# Patient Record
Sex: Female | Born: 1990 | Race: White | Hispanic: No | Marital: Single | Smoking: Current every day smoker
Health system: Southern US, Community
[De-identification: ages and names within clinical notes are randomized; demographics above are authoritative.]

## PROBLEM LIST (undated history)

## (undated) DIAGNOSIS — R630 Anorexia: Secondary | ICD-10-CM

## (undated) DIAGNOSIS — F32A Depression, unspecified: Secondary | ICD-10-CM

## (undated) DIAGNOSIS — F329 Major depressive disorder, single episode, unspecified: Secondary | ICD-10-CM

## (undated) HISTORY — PX: BREAST SURGERY: SHX581

---
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
## 2012-04-21 ENCOUNTER — Emergency Department: Payer: Self-pay | Admitting: Emergency Medicine

## 2012-04-21 LAB — URINALYSIS, COMPLETE
Bilirubin,UR: NEGATIVE
Glucose,UR: NEGATIVE mg/dL (ref 0–75)
Ketone: NEGATIVE
Nitrite: POSITIVE
Ph: 6 (ref 4.5–8.0)
Protein: 30
RBC,UR: 49 /HPF (ref 0–5)
WBC UR: 13 /HPF (ref 0–5)

## 2012-04-21 LAB — CBC
HCT: 38.9 % (ref 35.0–47.0)
HGB: 13.5 g/dL (ref 12.0–16.0)
MCH: 32.8 pg (ref 26.0–34.0)
MCHC: 34.6 g/dL (ref 32.0–36.0)
RDW: 12.9 % (ref 11.5–14.5)
WBC: 7.5 10*3/uL (ref 3.6–11.0)

## 2012-09-02 ENCOUNTER — Encounter (HOSPITAL_COMMUNITY): Payer: Self-pay | Admitting: Emergency Medicine

## 2012-09-02 ENCOUNTER — Emergency Department (HOSPITAL_COMMUNITY)
Admission: EM | Admit: 2012-09-02 | Discharge: 2012-09-02 | Disposition: A | Discharge status: Home / Self Care | Payer: 59 | Attending: Emergency Medicine | Admitting: Emergency Medicine

## 2012-09-02 DIAGNOSIS — R630 Anorexia: Secondary | ICD-10-CM | POA: Insufficient documentation

## 2012-09-02 DIAGNOSIS — F3289 Other specified depressive episodes: Secondary | ICD-10-CM | POA: Insufficient documentation

## 2012-09-02 DIAGNOSIS — F1110 Opioid abuse, uncomplicated: Secondary | ICD-10-CM | POA: Insufficient documentation

## 2012-09-02 DIAGNOSIS — F17200 Nicotine dependence, unspecified, uncomplicated: Secondary | ICD-10-CM | POA: Insufficient documentation

## 2012-09-02 DIAGNOSIS — F1910 Other psychoactive substance abuse, uncomplicated: Secondary | ICD-10-CM

## 2012-09-02 DIAGNOSIS — Z79899 Other long term (current) drug therapy: Secondary | ICD-10-CM | POA: Insufficient documentation

## 2012-09-02 DIAGNOSIS — F329 Major depressive disorder, single episode, unspecified: Secondary | ICD-10-CM | POA: Insufficient documentation

## 2012-09-02 HISTORY — DX: Anorexia: R63.0

## 2012-09-02 HISTORY — DX: Major depressive disorder, single episode, unspecified: F32.9

## 2012-09-02 HISTORY — DX: Depression, unspecified: F32.A

## 2012-09-02 LAB — COMPREHENSIVE METABOLIC PANEL
ALT: 11 U/L (ref 0–35)
AST: 22 U/L (ref 0–37)
Albumin: 4.6 g/dL (ref 3.5–5.2)
Alkaline Phosphatase: 68 U/L (ref 39–117)
Calcium: 9.9 mg/dL (ref 8.4–10.5)
Potassium: 4.5 mEq/L (ref 3.5–5.1)
Sodium: 137 mEq/L (ref 135–145)
Total Protein: 7.6 g/dL (ref 6.0–8.3)

## 2012-09-02 LAB — RAPID URINE DRUG SCREEN, HOSP PERFORMED
Amphetamines: NOT DETECTED
Barbiturates: NOT DETECTED
Opiates: NOT DETECTED
Tetrahydrocannabinol: NOT DETECTED

## 2012-09-02 LAB — CBC WITH DIFFERENTIAL/PLATELET
Basophils Absolute: 0 10*3/uL (ref 0.0–0.1)
Eosinophils Absolute: 0.2 10*3/uL (ref 0.0–0.7)
Eosinophils Relative: 2 % (ref 0–5)
MCH: 31.6 pg (ref 26.0–34.0)
MCV: 91.2 fL (ref 78.0–100.0)
Neutrophils Relative %: 54 % (ref 43–77)
Platelets: 232 10*3/uL (ref 150–400)
RDW: 12.1 % (ref 11.5–15.5)
WBC: 8.8 10*3/uL (ref 4.0–10.5)

## 2012-09-02 MED ORDER — IBUPROFEN 200 MG PO TABS: 600.0000 mg | ORAL_TABLET | Freq: Three times a day (TID) | ORAL | Status: DC | PRN

## 2012-09-02 MED ORDER — ONDANSETRON HCL 8 MG PO TABS: 4.0000 mg | ORAL_TABLET | Freq: Three times a day (TID) | ORAL | Status: DC | PRN

## 2012-09-02 MED ORDER — ALUM & MAG HYDROXIDE-SIMETH 200-200-20 MG/5ML PO SUSP: 30.0000 mL | ORAL | Status: DC | PRN

## 2012-09-02 MED ORDER — ACETAMINOPHEN 325 MG PO TABS: 650.0000 mg | ORAL_TABLET | ORAL | Status: DC | PRN

## 2012-09-02 MED ORDER — DIPHENHYDRAMINE HCL 25 MG PO CAPS
12.5000 mg | ORAL_CAPSULE | Freq: Once | ORAL | Status: AC
  Administered 2012-09-02: 25 mg via ORAL
  Filled 2012-09-02: qty 1

## 2012-09-02 NOTE — ED Notes (Signed)
PT. REQUESTING DETOX FOR PERCOCET / CLONAZEPAM ABUSE , DENIES SUICIDAL IDEATION .

## 2012-09-02 NOTE — ED Notes (Signed)
Report received from EG, RN: Pt alert, passively interactive, poor eye contact, ambulatory from POD A to POD C room 25, walking with NT, wearing blue paper scrubs, voluntary for depression & detox, has denied SI/HI, here with grandmother, belongings taken to car by grandmother.

## 2012-09-02 NOTE — ED Notes (Signed)
ACT out of room, pt & grandmother following. Pt intent to leave, does not want to stay or continue further care, assessment or txment. Refused resource list offered by ACT team. Instructed pt in the correct process of being d/c'd, pt unhappy but agreeable. Back to be to rest/sleep/lie down.

## 2012-09-02 NOTE — ED Notes (Signed)
ACT team in to see pt

## 2012-09-02 NOTE — ED Provider Notes (Signed)
History     CSN: [id number]  Arrival date & time 09/02/12  4098   First MD Initiated Contact with Patient 09/02/12 0251      Chief Complaint  Patient presents with  . Drug Problem    (Consider location/radiation/quality/duration/timing/severity/associated sxs/prior treatment) Patient is a 22 y.o. female presenting with drug problem. The history is provided by the patient. No language interpreter was used.  Drug Problem This is a chronic problem. The current episode started more than 1 week ago. The problem occurs constantly. The problem has not changed since onset.Pertinent negatives include no chest pain, no abdominal pain, no headaches and no shortness of breath. Nothing aggravates the symptoms. She has tried nothing for the symptoms.  Is trying to come off clonezepam and percocet.  Last dose yesterday.  No si or hi  Past Medical History  Diagnosis Date  . Anorexia   . Depression     History reviewed. No pertinent past surgical history.  No family history on file.  History  Substance Use Topics  . Smoking status: Current Every Day Smoker  . Smokeless tobacco: Not on file  . Alcohol Use: Yes    OB History    Grav Para Term Preterm Abortions TAB SAB Ect Mult Living                  Review of Systems  Respiratory: Negative for shortness of breath.   Cardiovascular: Negative for chest pain.  Gastrointestinal: Negative for abdominal pain.  Neurological: Negative for headaches.  Psychiatric/Behavioral: Negative for suicidal ideas, hallucinations and self-injury.  All other systems reviewed and are negative.    Allergies  Chocolate and Lactose intolerance (gi)  Home Medications   Current Outpatient Rx  Name  Route  Sig  Dispense  Refill  . CLONAZEPAM 0.5 MG PO TABS   Oral   Take 0.5 mg by mouth 2 (two) times daily.         . NORETHINDRONE 0.35 MG PO TABS   Oral   Take 1 tablet by mouth daily.           BP 116/69  Pulse 83  Temp 98.1 F (36.7 C)  (Oral)  Resp 16  SpO2 100%  Physical Exam  Constitutional: She is oriented to person, place, and time. She appears well-developed and well-nourished. No distress.  HENT:  Head: Normocephalic and atraumatic.  Mouth/Throat: Oropharynx is clear and moist.  Eyes: Conjunctivae normal are normal. Pupils are equal, round, and reactive to light.  Neck: Normal range of motion. Neck supple.  Cardiovascular: Normal rate, regular rhythm and intact distal pulses.   Pulmonary/Chest: Effort normal and breath sounds normal. She has no wheezes.  Abdominal: Soft. Bowel sounds are normal. There is no tenderness. There is no rebound and no guarding.  Musculoskeletal: Normal range of motion.  Neurological: She is alert and oriented to person, place, and time.  Skin: Skin is warm and dry.  Psychiatric: She has a normal mood and affect.    ED Course  Procedures (including critical care time)  Labs Reviewed  URINE RAPID DRUG SCREEN (HOSP PERFORMED) - Abnormal; Notable for the following:    Benzodiazepines POSITIVE (*)     All other components within normal limits  COMPREHENSIVE METABOLIC PANEL - Abnormal; Notable for the following:    Glucose, Bld 155 (*)     All other components within normal limits  SALICYLATE LEVEL - Abnormal; Notable for the following:    Salicylate Lvl <2.0 (*)  All other components within normal limits  ETHANOL  CBC WITH DIFFERENTIAL  ACETAMINOPHEN LEVEL   No results found.   No diagnosis found.    MDM  Discussed with ACT team.  Patient has no narcotics in her urine and appears safe for d/c at this time        Demmi Sindt Smitty Cords, MD 09/02/12 1610

## 2012-09-02 NOTE — ED Notes (Signed)
Grandmother back into room with belongings.

## 2013-04-30 ENCOUNTER — Ambulatory Visit: Payer: Self-pay | Admitting: Internal Medicine

## 2013-05-07 ENCOUNTER — Ambulatory Visit: Payer: Self-pay | Admitting: Internal Medicine

## 2013-08-28 ENCOUNTER — Encounter (HOSPITAL_COMMUNITY): Payer: Self-pay | Admitting: Emergency Medicine

## 2013-08-28 ENCOUNTER — Encounter (HOSPITAL_COMMUNITY): Payer: Self-pay

## 2013-08-28 ENCOUNTER — Emergency Department (HOSPITAL_COMMUNITY)
Admission: EM | Admit: 2013-08-28 | Discharge: 2013-08-28 | Disposition: A | Discharge status: Other Acute Inpatient Hospital | Payer: 59 | Attending: Emergency Medicine | Admitting: Emergency Medicine

## 2013-08-28 ENCOUNTER — Inpatient Hospital Stay (HOSPITAL_COMMUNITY)
Admission: EM | Admit: 2013-08-28 | Discharge: 2013-08-31 | DRG: 897 | Disposition: A | Discharge status: Home / Self Care | Payer: 59 | Source: Intra-hospital | Attending: Psychiatry | Admitting: Psychiatry

## 2013-08-28 DIAGNOSIS — F411 Generalized anxiety disorder: Secondary | ICD-10-CM | POA: Diagnosis present

## 2013-08-28 DIAGNOSIS — F1010 Alcohol abuse, uncomplicated: Secondary | ICD-10-CM

## 2013-08-28 DIAGNOSIS — R630 Anorexia: Secondary | ICD-10-CM | POA: Insufficient documentation

## 2013-08-28 DIAGNOSIS — F3289 Other specified depressive episodes: Secondary | ICD-10-CM | POA: Insufficient documentation

## 2013-08-28 DIAGNOSIS — F1910 Other psychoactive substance abuse, uncomplicated: Secondary | ICD-10-CM | POA: Insufficient documentation

## 2013-08-28 DIAGNOSIS — Z79899 Other long term (current) drug therapy: Secondary | ICD-10-CM | POA: Insufficient documentation

## 2013-08-28 DIAGNOSIS — F1110 Opioid abuse, uncomplicated: Secondary | ICD-10-CM

## 2013-08-28 DIAGNOSIS — Z3202 Encounter for pregnancy test, result negative: Secondary | ICD-10-CM | POA: Insufficient documentation

## 2013-08-28 DIAGNOSIS — F329 Major depressive disorder, single episode, unspecified: Secondary | ICD-10-CM | POA: Insufficient documentation

## 2013-08-28 DIAGNOSIS — F1410 Cocaine abuse, uncomplicated: Secondary | ICD-10-CM

## 2013-08-28 DIAGNOSIS — F1310 Sedative, hypnotic or anxiolytic abuse, uncomplicated: Secondary | ICD-10-CM

## 2013-08-28 DIAGNOSIS — IMO0001 Reserved for inherently not codable concepts without codable children: Secondary | ICD-10-CM | POA: Insufficient documentation

## 2013-08-28 DIAGNOSIS — F1994 Other psychoactive substance use, unspecified with psychoactive substance-induced mood disorder: Secondary | ICD-10-CM | POA: Diagnosis present

## 2013-08-28 DIAGNOSIS — F1120 Opioid dependence, uncomplicated: Principal | ICD-10-CM | POA: Diagnosis present

## 2013-08-28 DIAGNOSIS — F17200 Nicotine dependence, unspecified, uncomplicated: Secondary | ICD-10-CM | POA: Insufficient documentation

## 2013-08-28 DIAGNOSIS — F1920 Other psychoactive substance dependence, uncomplicated: Principal | ICD-10-CM

## 2013-08-28 LAB — COMPREHENSIVE METABOLIC PANEL
ALT: 8 U/L (ref 0–35)
AST: 15 U/L (ref 0–37)
Albumin: 4.4 g/dL (ref 3.5–5.2)
Alkaline Phosphatase: 66 U/L (ref 39–117)
BUN: 7 mg/dL (ref 6–23)
CALCIUM: 9.4 mg/dL (ref 8.4–10.5)
CO2: 27 mEq/L (ref 19–32)
CREATININE: 0.6 mg/dL (ref 0.50–1.10)
Chloride: 99 mEq/L (ref 96–112)
GFR calc non Af Amer: >90 mL/min (ref >90)
Glucose, Bld: 94 mg/dL (ref 70–99)
Potassium: 4.3 mEq/L (ref 3.7–5.3)
SODIUM: 139 meq/L (ref 137–147)
TOTAL PROTEIN: 7.2 g/dL (ref 6.0–8.3)
Total Bilirubin: 0.9 mg/dL (ref 0.3–1.2)

## 2013-08-28 LAB — CBC
HCT: 39.7 % (ref 36.0–46.0)
Hemoglobin: 13.7 g/dL (ref 12.0–15.0)
MCH: 32.7 pg (ref 26.0–34.0)
MCHC: 34.5 g/dL (ref 30.0–36.0)
MCV: 94.7 fL (ref 78.0–100.0)
PLATELETS: 232 10*3/uL (ref 150–400)
RBC: 4.19 MIL/uL (ref 3.87–5.11)
RDW: 12.8 % (ref 11.5–15.5)
WBC: 10.5 10*3/uL (ref 4.0–10.5)

## 2013-08-28 LAB — RAPID URINE DRUG SCREEN, HOSP PERFORMED
AMPHETAMINES: NOT DETECTED
Barbiturates: NOT DETECTED
Benzodiazepines: POSITIVE — AB
COCAINE: NOT DETECTED
Opiates: POSITIVE — AB
TETRAHYDROCANNABINOL: NOT DETECTED

## 2013-08-28 LAB — ETHANOL: Alcohol, Ethyl (B): <11 mg/dL (ref 0–11)

## 2013-08-28 LAB — POCT PREGNANCY, URINE: Preg Test, Ur: NEGATIVE

## 2013-08-28 LAB — SALICYLATE LEVEL

## 2013-08-28 LAB — ACETAMINOPHEN LEVEL: Acetaminophen (Tylenol), Serum: <15 ug/mL (ref 10–30)

## 2013-08-28 MED ORDER — NICOTINE 21 MG/24HR TD PT24: 21.0000 mg | MEDICATED_PATCH | Freq: Every day | TRANSDERMAL | Status: DC

## 2013-08-28 MED ORDER — HYDROXYZINE HCL 25 MG PO TABS
50.0000 mg | ORAL_TABLET | Freq: Four times a day (QID) | ORAL | Status: DC | PRN
  Administered 2013-08-28: 50 mg via ORAL
  Filled 2013-08-28: qty 2

## 2013-08-28 MED ORDER — LOPERAMIDE HCL 2 MG PO CAPS: 2.0000 mg | ORAL_CAPSULE | ORAL | Status: DC | PRN

## 2013-08-28 MED ORDER — DICYCLOMINE HCL 20 MG PO TABS
20.0000 mg | ORAL_TABLET | Freq: Four times a day (QID) | ORAL | Status: DC | PRN
  Administered 2013-08-29 – 2013-08-30 (×3): 20 mg via ORAL
  Filled 2013-08-28 (×3): qty 1

## 2013-08-28 MED ORDER — HYDROXYZINE HCL 25 MG PO TABS
25.0000 mg | ORAL_TABLET | Freq: Four times a day (QID) | ORAL | Status: DC | PRN
  Administered 2013-08-28 – 2013-08-29 (×2): 25 mg via ORAL
  Filled 2013-08-28 (×2): qty 1

## 2013-08-28 MED ORDER — NAPROXEN 250 MG PO TABS: 500.0000 mg | ORAL_TABLET | Freq: Two times a day (BID) | ORAL | Status: DC | PRN

## 2013-08-28 MED ORDER — CLONIDINE HCL 0.1 MG PO TABS: 0.1000 mg | ORAL_TABLET | Freq: Every day | ORAL | Status: DC

## 2013-08-28 MED ORDER — ALUM & MAG HYDROXIDE-SIMETH 200-200-20 MG/5ML PO SUSP
30.0000 mL | ORAL | Status: DC | PRN
Start: 2013-08-28 — End: 2013-08-28

## 2013-08-28 MED ORDER — ONDANSETRON 4 MG PO TBDP: 4.0000 mg | ORAL_TABLET | Freq: Four times a day (QID) | ORAL | Status: DC | PRN

## 2013-08-28 MED ORDER — ALUM & MAG HYDROXIDE-SIMETH 200-200-20 MG/5ML PO SUSP: 30.0000 mL | ORAL | Status: DC | PRN

## 2013-08-28 MED ORDER — CLONIDINE HCL 0.1 MG PO TABS
0.1000 mg | ORAL_TABLET | Freq: Four times a day (QID) | ORAL | Status: AC
  Administered 2013-08-28 – 2013-08-30 (×5): 0.1 mg via ORAL
  Filled 2013-08-28 (×12): qty 1

## 2013-08-28 MED ORDER — LORAZEPAM 1 MG PO TABS
1.0000 mg | ORAL_TABLET | Freq: Three times a day (TID) | ORAL | Status: DC | PRN
  Administered 2013-08-28: 1 mg via ORAL
  Filled 2013-08-28: qty 1

## 2013-08-28 MED ORDER — METHOCARBAMOL 500 MG PO TABS: 1000.0000 mg | ORAL_TABLET | Freq: Four times a day (QID) | ORAL | Status: DC | PRN

## 2013-08-28 MED ORDER — ACETAMINOPHEN 325 MG PO TABS: 650.0000 mg | ORAL_TABLET | Freq: Four times a day (QID) | ORAL | Status: DC | PRN

## 2013-08-28 MED ORDER — METHOCARBAMOL 500 MG PO TABS
500.0000 mg | ORAL_TABLET | Freq: Three times a day (TID) | ORAL | Status: DC | PRN
  Administered 2013-08-28 – 2013-08-29 (×3): 500 mg via ORAL
  Filled 2013-08-28 (×3): qty 1

## 2013-08-28 MED ORDER — LEVONORGESTREL 20 MCG/24HR IU IUD: 1.0000 | INTRAUTERINE_SYSTEM | Freq: Once | INTRAUTERINE | Status: DC

## 2013-08-28 MED ORDER — NICOTINE POLACRILEX 2 MG MT GUM
CHEWING_GUM | OROMUCOSAL | Status: AC
  Administered 2013-08-28: 22:00:00
  Filled 2013-08-28: qty 1

## 2013-08-28 MED ORDER — NAPROXEN 250 MG PO TABS
500.0000 mg | ORAL_TABLET | Freq: Two times a day (BID) | ORAL | Status: DC | PRN
  Administered 2013-08-28 – 2013-08-30 (×3): 500 mg via ORAL
  Filled 2013-08-28: qty 1
  Filled 2013-08-28 (×2): qty 2
  Filled 2013-08-28 (×2): qty 1

## 2013-08-28 MED ORDER — TRAZODONE HCL 50 MG PO TABS
50.0000 mg | ORAL_TABLET | Freq: Every evening | ORAL | Status: DC | PRN
  Administered 2013-08-28 – 2013-08-30 (×5): 50 mg via ORAL
  Filled 2013-08-28 (×11): qty 1

## 2013-08-28 MED ORDER — DICYCLOMINE HCL 20 MG PO TABS: 20.0000 mg | ORAL_TABLET | Freq: Four times a day (QID) | ORAL | Status: DC | PRN

## 2013-08-28 MED ORDER — CLONIDINE HCL 0.1 MG PO TABS
0.1000 mg | ORAL_TABLET | ORAL | Status: DC
Start: 2013-08-31 — End: 2013-08-31
  Filled 2013-08-28 (×2): qty 1

## 2013-08-28 MED ORDER — CLONIDINE HCL 0.1 MG PO TABS: 0.1000 mg | ORAL_TABLET | ORAL | Status: DC

## 2013-08-28 MED ORDER — CLONIDINE HCL 0.1 MG PO TABS: 0.1000 mg | ORAL_TABLET | Freq: Four times a day (QID) | ORAL | Status: DC

## 2013-08-28 MED ORDER — MAGNESIUM HYDROXIDE 400 MG/5ML PO SUSP
30.0000 mL | Freq: Every day | ORAL | Status: DC | PRN
  Administered 2013-08-30: 30 mL via ORAL

## 2013-08-28 MED ORDER — ONDANSETRON 4 MG PO TBDP
4.0000 mg | ORAL_TABLET | Freq: Four times a day (QID) | ORAL | Status: DC | PRN
  Administered 2013-08-30: 4 mg via ORAL
  Filled 2013-08-28: qty 1

## 2013-08-28 MED ORDER — ACETAMINOPHEN 325 MG PO TABS: 650.0000 mg | ORAL_TABLET | ORAL | Status: DC | PRN

## 2013-08-28 NOTE — ED Notes (Signed)
Turkey sandwich and apple sauce given to patient.  

## 2013-08-28 NOTE — ED Notes (Signed)
Pt is here for detox from benzos and opiates.  Last use was 2 days ago.   No SI/HI.

## 2013-08-28 NOTE — Progress Notes (Signed)
Pt is a 23 year old female admitted after requesting detox from opiates and benzos   She takes vicodin, oxycodone, oxycontin, clonipin and xanax   She has been using since she was [age]    She has a history of ETOH abuse but does not currently drink    She [facility] after she is detoxed    She denies suicidal and homicidal ideation    She is anxious and fidgety  She is irritable but does cooperate   She was tearful during the assessment   She minimizes her use and her withdrawal symptoms   She lives with her grandparent and they are supportive    She was given nourishment and oriented to the unit   Medications were administered and effectiveness monitored    Q 15 min checks   Pt safe at present but remains irritable

## 2013-08-28 NOTE — Tx Team (Signed)
Initial Interdisciplinary Treatment Plan  PATIENT STRENGTHS: (choose at least two) Average or above average intelligence Capable of independent living General fund of knowledge Supportive family/friends  PATIENT STRESSORS: Medication change or noncompliance Substance abuse   PROBLEM LIST: Problem List/Patient Goals Date to be addressed Date deferred Reason deferred Estimated date of resolution  Polysubstance Abuse                                                       DISCHARGE CRITERIA:  Ability to meet basic life and health needs Motivation to continue treatment in a less acute level of care Verbal commitment to aftercare and medication compliance Withdrawal symptoms are absent or subacute and managed without 24-hour nursing intervention  PRELIMINARY DISCHARGE PLAN: Attend 12-step recovery group Return to previous living arrangement  PATIENT/FAMIILY INVOLVEMENT: This treatment plan has been presented to and reviewed with the patient, Elaine Warner, and/or family member, .  The patient and family have been given the opportunity to ask questions and make suggestions.  Elaine Warner, Elaine Warner J 08/28/2013, 9:01 PM

## 2013-08-28 NOTE — BH Assessment (Signed)
Assessment Note  Elaine Warner is an 23 y.o. female. Per ED notes: "Patient reports she wants detox from benzodiazepines and narcotics. She states 2 days ago she went to [facility] however she was so intoxicated they were concerned about her heart rate. They sent her to [facility] emergency department where she was seen but left after one to 2 hours. It is not clear what the physical problem they were concerned about was. They report they were told she needed "medical clearance" before she can go back to [facility]. She states she was highly intoxicated that day. She states she took her last Xanax 1 mg at 7 AM. She states she only takes Xanax 3 times a week. However she is prescribed clonazepam 0.5 mg twice a day. She states she had it last filled on December 27, #60 tablets and she only has 3 left. She also states she takes 20-30 mg of Percocet, Roxicodone, oxycodone daily. She states she has been using since she was 23 years old. She states she's never been to detox or rehabilitation in the past. She states she has been going to [facility] about 2 months.  Patient denies feeling depressed, she also denies suicidal or homicidal ideation".   Writer met with patient to complete her tele assessment. Patient admits that she does not want to further use substances. She reports use of Benzo's, Opiates, and Cocaine. SEE ADDITIONAL SOCIAL HISTORY for further details. Patient denies SI, HI, and AVH's. No history of inpatient hospitalizations. She does has a therapist Dr. Lenon Oms, [facility].   Axis I: Polysubstance Abuse Axis II: Deferred Axis III:  Past Medical History  Diagnosis Date  . Anorexia   . Depression    Axis IV: other psychosocial or environmental problems, problems related to social environment, problems with access to health care services and problems with primary support group Axis V: 31-40 impairment in reality testing  Past Medical History:  Past Medical History  Diagnosis Date  . Anorexia   .  Depression     Past Surgical History  Procedure Laterality Date  . Breast surgery      Family History: No family history on file.  Social History:  reports that she has been smoking.  She does not have any smokeless tobacco history on file. She reports that she drinks alcohol. She reports that she uses illicit drugs.  Additional Social History:  Alcohol / Drug Use Pain Medications: SEE MAR Prescriptions: SEE MAR Over the Counter: SEE MAR History of alcohol / drug use?: Yes Substance #1 Name of Substance 1: Benzo's-Xanax and Klonopin  1 - Age of First Use: 23 yrs old  1 - Amount (size/oz): Xanax-varies; Klonopin-0.36m 1 - Frequency: patient takes the xanax 3-4x's per week; Klonopin patient takes daily 1 - Duration: ongoing since age 23 1- Last Use / Amount: this morning at 7am Substance #2 Name of Substance 2: Opiates-Oxycotin, Rox's, Tramadal, Hydrocodin, and Vicodin 2 - Age of First Use: 23yrs old  2 - Amount (size/oz): "I take them like M&M's" 2 - Frequency: daily  2 - Duration: on-going since age [age] - Last Use / Amount: this morning at 7am Substance #3 Name of Substance 3: Cocaine  3 - Age of First Use: 23yrs old  3 - Amount (size/oz): 1 gram 3 - Frequency: "couple times per month" 3 - Duration: on-going  3 - Last Use / Amount: 1 month ago  CIWA: CIWA-Ar BP: 126/69 mmHg Pulse Rate: 74 COWS:  Allergies:  Allergies  Allergen Reactions  . Lactose Intolerance (Gi) Other (See Comments)    bloating    Home Medications:  (Not in a hospital admission)  OB/GYN Status:  No LMP recorded. Patient is not currently having periods (Reason: IUD).  General Assessment Data Location of Assessment: [facility] ED Is this a Tele or Face-to-Face Assessment?: Tele Assessment Is this an Initial Assessment or a Re-assessment for this encounter?: Initial Assessment Living Arrangements: Other (Comment) (Pt lives with her grandmother) Can pt return to current living arrangement?:  Yes Admission Status: Voluntary Is patient capable of signing voluntary admission?: Yes Transfer from: [facility] Referral Source: Other     [facility] Crisis Care Plan Living Arrangements: Other (Comment) (Pt lives with her grandmother) Name of Psychiatrist:  (No psychiatrist) Name of Therapist:  Ambulance person)     Risk to self Suicidal Ideation: No Suicidal Intent: No Is patient at risk for suicide?: No Suicidal Plan?: No Access to Means: No What has been your use of drugs/alcohol within the last 12 months?:  (n/a) Previous Attempts/Gestures: No How many times?:  (n/a) Other Self Harm Risks:  (n/a) Triggers for Past Attempts: Other (Comment) (no previous attempts or gestures) Intentional Self Injurious Behavior: None Family Suicide History: No Recent stressful life event(s): Other (Comment) ("trying to find help for my substance abuse problems") Persecutory voices/beliefs?: No Depression: Yes Depression Symptoms: Guilt;Loss of interest in usual pleasures Substance abuse history and/or treatment for substance abuse?: No Suicide prevention information given to non-admitted patients: Not applicable  Risk to Others Homicidal Ideation: No Thoughts of Harm to Others: No Current Homicidal Intent: No Current Homicidal Plan: No Access to Homicidal Means: No Identified Victim:  (n/a) History of harm to others?: No Assessment of Violence: None Noted Violent Behavior Description:  (patient is calm and cooperative ) Does patient have access to weapons?: No Criminal Charges Pending?: No Does patient have a court date: No  Psychosis Hallucinations: None noted Delusions: None noted  Mental Status Report Appear/Hygiene: Disheveled Eye Contact: Good Motor Activity: Freedom of movement Speech: Logical/coherent Level of Consciousness: Alert Mood: Depressed Affect: Appropriate to circumstance Anxiety Level: None Thought Processes: Coherent;Relevant Judgement:  Unimpaired Orientation: Person;Place;Time;Situation Obsessive Compulsive Thoughts/Behaviors: None  Cognitive Functioning Concentration: Normal Memory: Recent Intact;Remote Intact IQ: Average Insight: Poor Impulse Control: Poor Appetite: Poor Weight Loss:  (pt reports loosing 10 pounds) Weight Gain:  (none reported) Sleep: Decreased Total Hours of Sleep:  (varies- "I am sleeping 4 hours at this time") Vegetative Symptoms: None  ADLScreening [facility] Assessment Services) Patient's cognitive ability adequate to safely complete daily activities?: Yes Patient able to express need for assistance with ADLs?: Yes Independently performs ADLs?: Yes (appropriate for developmental age)  Prior Inpatient Therapy Prior Inpatient Therapy: No Prior Therapy Dates:  (n/a) Prior Therapy Facilty/Provider(s):  (n/a) Reason for Treatment:  (n/a)  Prior Outpatient Therapy Prior Outpatient Therapy: Yes Prior Therapy Dates:  (current) Prior Therapy Facilty/Provider(s):  (Dr. Orpah Cobb, [facility]) Reason for Treatment:  (therapy)  ADL Screening (condition at time of admission) Patient's cognitive ability adequate to safely complete daily activities?: Yes Is the patient deaf or have difficulty hearing?: No Does the patient have difficulty seeing, even when wearing glasses/contacts?: No Does the patient have difficulty concentrating, remembering, or making decisions?: No Patient able to express need for assistance with ADLs?: Yes Does the patient have difficulty dressing or bathing?: No Independently performs ADLs?: Yes (appropriate for developmental age) Communication: Independent Dressing (OT): Independent Grooming: Independent Feeding: Independent Bathing: Independent Toileting: Independent In/Out  Bed: Independent Walks in Home: Independent Does the patient have difficulty walking or climbing stairs?: No Weakness of Legs: None Weakness of Arms/Hands: None  Home Assistive Devices/Equipment Home  Assistive Devices/Equipment: None    Abuse/Neglect Assessment (Assessment to be complete while patient is alone) Physical Abuse: Denies Verbal Abuse: Denies Sexual Abuse: Denies Exploitation of patient/patient's resources: Denies Self-Neglect: Denies Values / Beliefs Cultural Requests During Hospitalization: None Spiritual Requests During Hospitalization: None   Advance Directives (For Healthcare) Advance Directive: Patient does not have advance directive Nutrition Screen- [place] Adult/WL/AP Patient's home diet: Regular  Additional Information 1:1 In Past 12 Months?: No CIRT Risk: No Elopement Risk: No Does patient have medical clearance?: Yes     Disposition:  Disposition Initial Assessment Completed for this Encounter: Yes Disposition of Patient: Inpatient treatment program Type of inpatient treatment program: Adult (Accepted by Agustina Caroli, NP to Carlton Adam, MD Rm 301-2)  On Site Evaluation by:   Reviewed with Physician:    Evangeline Gula 08/28/2013 4:20 PM

## 2013-08-28 NOTE — BH Assessment (Signed)
[facility] Serena ColonelAggie Nwoko, NP to Dr. Geoffery LyonsIrving Lugo. Room assignment is 301-2. Writer notified patient's nurse who will assist with support paperwork. [facility].

## 2013-08-28 NOTE — ED Notes (Signed)
Consult with TTS now at bedside.

## 2013-08-28 NOTE — ED Provider Notes (Signed)
CSN: [id number]     Arrival date & time 08/28/13  4098 History   First MD Initiated Contact with Patient 08/28/13 1105     No chief complaint on file.  (Consider location/radiation/quality/duration/timing/severity/associated sxs/prior Treatment) HPI  Patient reports she wants detox from benzodiazepines and narcotics. She states 2 days ago she went to [facility] however she was so intoxicated they were concerned about her heart rate. They sent her to [facility] emergency department where she was seen but left after one to 2 hours. It is not clear what the physical problem they were concerned about was. They report they were told she needed "medical clearance" before she can go back to [facility]. She states she was highly intoxicated that day. She states she took her last Xanax 1 mg at 7 AM. She states she only takes Xanax 3 times a week. However she is prescribed clonazepam 0.5 mg twice a day. She states she had it last filled on December 27, #60 tablets and she only has 3 left. She also states she takes 20-30 mg of Percocet, Roxicodone, oxycodone daily. She states she has been using since she was 23 years old. She states she's never been to detox or rehabilitation in the past. She states she has been going to [facility] about 2 months.  Patient denies feeling depressed, she also denies suicidal or homicidal ideation.  Patient denies any physical symptoms such as coughing, sneezing, vomiting, or diarrhea. She does however state she feels like she is startingto go into withdrawal with some body aches.  PCP Dr Alison Stalling of [facility] on [address] (prescribed clonazepam) PA Esperanza Sheets, [facility] prescribes vicodin  Past Medical History  Diagnosis Date  . Anorexia   . Depression    Past Surgical History  Procedure Laterality Date  . Breast surgery     No family history on file. History  Substance Use Topics  . Smoking status: Current Every Day Smoker  . Smokeless  tobacco: Not on file  . 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   OB History   Grav Para Term Preterm Abortions TAB SAB Ect Mult Living                 Review of Systems  All other systems reviewed and are negative.    Allergies  Lactose intolerance (gi)  Home Medications   Current Outpatient Rx  Name  Route  Sig  Dispense  Refill  . ALPRAZolam (XANAX) 1 MG tablet   Oral   Take 1-2 mg by mouth daily.         . clonazePAM (KLONOPIN) 0.5 MG tablet   Oral   Take 3-5 mg by mouth 4 (four) times daily.          Marland Kitchen HYDROcodone-acetaminophen (LORTAB) 7.5-500 MG per tablet   Oral   Take 2 tablets by mouth every 4 (four) hours.         Marland Kitchen levonorgestrel (MIRENA) 20 MCG/24HR IUD   Intrauterine   1 each by Intrauterine route once.         . OxyCODONE HCl (OXYCONTIN PO)   Oral   Take 2 tablets by mouth daily.         Marland Kitchen oxyCODONE-acetaminophen (PERCOCET) 10-325 MG per tablet  Oral   Take 2 tablets by mouth daily.         . traMADol (ULTRAM) 50 MG tablet   Oral   Take 50-100 mg by mouth 2 (two) times daily.          BP 126/69  Pulse 74  Temp(Src) 97.7 F (36.5 C) (Oral)  Resp 20  SpO2 96%  Vital signs normal   Physical Exam  Nursing note and vitals reviewed. Constitutional: She is oriented to person, place, and time. She appears well-developed and well-nourished.  Non-toxic appearance. She does not appear ill. No distress.  Speaks with an accent, states she was born in the [place] but her grandparents were from the [place]  HENT:  Head: Normocephalic and atraumatic.  Right Ear: External ear normal.  Left Ear: External ear normal.  Nose: Nose normal. No mucosal edema or rhinorrhea.  Mouth/Throat: Oropharynx is clear and moist and mucous membranes are normal. No dental abscesses or  uvula swelling.  Eyes: Conjunctivae and EOM are normal. Pupils are equal, round, and reactive to light.  Neck: Normal range of motion and full passive range of motion without pain. Neck supple.  Cardiovascular: Normal rate, regular rhythm and normal heart sounds.  Exam reveals no gallop and no friction rub.   No murmur heard. Pulmonary/Chest: Effort normal and breath sounds normal. No respiratory distress. She has no wheezes. She has no rhonchi. She has no rales. She exhibits no tenderness and no crepitus.  Abdominal: Soft. Normal appearance and bowel sounds are normal. She exhibits no distension. There is no tenderness. There is no rebound and no guarding.  Musculoskeletal: Normal range of motion. She exhibits no edema and no tenderness.  Moves all extremities well.   Neurological: She is alert and oriented to person, place, and time. She has normal strength. No cranial nerve deficit.  Skin: Skin is warm, dry and intact. No rash noted. No erythema. No pallor.  Psychiatric: She has a normal mood and affect. Her speech is normal and behavior is normal. Her mood appears not anxious.    ED Course  Procedures (including critical care time)  Medications  LORazepam (ATIVAN) tablet 1 mg (1 mg Oral Given 08/28/13 1304)  acetaminophen (TYLENOL) tablet 650 mg (not administered)  nicotine (NICODERM CQ - dosed in mg/24 hours) patch 21 mg (21 mg Transdermal Not Given 08/28/13 1305)  alum & mag hydroxide-simeth (MAALOX/MYLANTA) 200-200-20 MG/5ML suspension 30 mL (not administered)  dicyclomine (BENTYL) tablet 20 mg (not administered)  hydrOXYzine (ATARAX/VISTARIL) tablet 50 mg (50 mg Oral Given 08/28/13 1303)  loperamide (IMODIUM) capsule 2-4 mg (not administered)  methocarbamol (ROBAXIN) tablet 1,000 mg (not administered)  naproxen (NAPROSYN) tablet 500 mg (not administered)  ondansetron (ZOFRAN-ODT) disintegrating tablet 4 mg (not administered)  cloNIDine (CATAPRES) tablet 0.1 mg (not administered)     Followed by  cloNIDine (CATAPRES) tablet 0.1 mg (not administered)    Followed by  cloNIDine (CATAPRES) tablet 0.1 mg (not administered)    Pt started on clonidine protocol for her narcotic addiction and kept on ativan at controlled dose for her benzo addiction. Benzos cannot be "cold turkeyed".   13:38 Discussed with [place], TSS about reason for consult.   PT accepted to [facility] by [facility] for Dr Verneda Skill Review Results for orders placed during the hospital encounter of 08/28/13  ACETAMINOPHEN LEVEL      Result Value Range   Acetaminophen (Tylenol), Serum <15.0  10 - 30 ug/mL  CBC  Result Value Range   WBC 10.5  4.0 - 10.5 K/uL   RBC 4.19  3.87 - 5.11 MIL/uL   Hemoglobin 13.7  12.0 - 15.0 g/dL   HCT 40.939.7  81.136.0 - 91.446.0 %   MCV 94.7  78.0 - 100.0 fL   MCH 32.7  26.0 - 34.0 pg   MCHC 34.5  30.0 - 36.0 g/dL   RDW 78.212.8  95.611.5 - 21.315.5 %   Platelets 232  150 - 400 K/uL  COMPREHENSIVE METABOLIC PANEL      Result Value Range   Sodium 139  137 - 147 mEq/L   Potassium 4.3  3.7 - 5.3 mEq/L   Chloride 99  96 - 112 mEq/L   CO2 27  19 - 32 mEq/L   Glucose, Bld 94  70 - 99 mg/dL   BUN 7  6 - 23 mg/dL   Creatinine, Ser 0.860.60  0.50 - 1.10 mg/dL   Calcium 9.4  8.4 - 57.810.5 mg/dL   Total Protein 7.2  6.0 - 8.3 g/dL   Albumin 4.4  3.5 - 5.2 g/dL   AST 15  0 - 37 U/L   ALT 8  0 - 35 U/L   Alkaline Phosphatase 66  39 - 117 U/L   Total Bilirubin 0.9  0.3 - 1.2 mg/dL   GFR calc non Af Amer >90  >90 mL/min   GFR calc Af Amer >90  >90 mL/min  ETHANOL      Result Value Range   Alcohol, Ethyl (B) <11  0 - 11 mg/dL  SALICYLATE LEVEL      Result Value Range   Salicylate Lvl <2.0 (*) 2.8 - 20.0 mg/dL  URINE RAPID DRUG SCREEN (HOSP PERFORMED)      Result Value Range   Opiates POSITIVE (*) NONE DETECTED   Cocaine NONE DETECTED  NONE DETECTED   Benzodiazepines POSITIVE (*) NONE DETECTED   Amphetamines NONE DETECTED  NONE DETECTED   Tetrahydrocannabinol NONE DETECTED  NONE DETECTED    Barbiturates NONE DETECTED  NONE DETECTED  POCT PREGNANCY, URINE      Result Value Range   Preg Test, Ur NEGATIVE  NEGATIVE   Laboratory interpretation all normal except + UDS    Imaging Review No results found.  EKG Interpretation   None       MDM   1. Narcotic abuse   2. Benzodiazepine abuse   3. Cocaine abuse   4. Alcohol abuse     [facility]  Devoria AlbeIva Corwyn Vora, MD, Armando GangFACEP    Ward GivensIva L Mannix Kroeker, MD 08/28/13 [phone]

## 2013-08-28 NOTE — BH Assessment (Addendum)
Tele assessment scheduled for this patient 1330. Writer spoke to EDP-Dr. Devoria AlbeIva Knapp to obtain clinicals prior to completing this patient's assessment .

## 2013-08-28 NOTE — ED Notes (Signed)
Patient placed in blue scrubs and wanded by security.

## 2013-08-29 ENCOUNTER — Encounter (HOSPITAL_COMMUNITY): Payer: Self-pay | Admitting: Psychiatry

## 2013-08-29 DIAGNOSIS — F1994 Other psychoactive substance use, unspecified with psychoactive substance-induced mood disorder: Secondary | ICD-10-CM

## 2013-08-29 DIAGNOSIS — F1120 Opioid dependence, uncomplicated: Secondary | ICD-10-CM

## 2013-08-29 DIAGNOSIS — F1920 Other psychoactive substance dependence, uncomplicated: Secondary | ICD-10-CM

## 2013-08-29 DIAGNOSIS — F1420 Cocaine dependence, uncomplicated: Secondary | ICD-10-CM

## 2013-08-29 DIAGNOSIS — F320 Major depressive disorder, single episode, mild: Secondary | ICD-10-CM

## 2013-08-29 DIAGNOSIS — F4310 Post-traumatic stress disorder, unspecified: Secondary | ICD-10-CM

## 2013-08-29 DIAGNOSIS — F1320 Sedative, hypnotic or anxiolytic dependence, uncomplicated: Secondary | ICD-10-CM

## 2013-08-29 DIAGNOSIS — F411 Generalized anxiety disorder: Secondary | ICD-10-CM

## 2013-08-29 MED ORDER — ADULT MULTIVITAMIN W/MINERALS CH
1.0000 | ORAL_TABLET | Freq: Every day | ORAL | Status: DC
  Administered 2013-08-30 – 2013-08-31 (×2): 1 via ORAL
  Filled 2013-08-29 (×3): qty 1

## 2013-08-29 MED ORDER — NICOTINE 21 MG/24HR TD PT24
21.0000 mg | MEDICATED_PATCH | Freq: Every day | TRANSDERMAL | Status: DC
  Administered 2013-08-29 – 2013-08-31 (×3): 21 mg via TRANSDERMAL
  Filled 2013-08-29 (×4): qty 1

## 2013-08-29 MED ORDER — CHLORDIAZEPOXIDE HCL 25 MG PO CAPS
25.0000 mg | ORAL_CAPSULE | Freq: Three times a day (TID) | ORAL | Status: AC
  Administered 2013-08-30 (×3): 25 mg via ORAL
  Filled 2013-08-29 (×2): qty 1

## 2013-08-29 MED ORDER — ENSURE COMPLETE PO LIQD
237.0000 mL | Freq: Two times a day (BID) | ORAL | Status: DC
  Administered 2013-08-30 – 2013-08-31 (×2): 237 mL via ORAL

## 2013-08-29 MED ORDER — CHLORDIAZEPOXIDE HCL 25 MG PO CAPS
25.0000 mg | ORAL_CAPSULE | ORAL | Status: DC
  Administered 2013-08-31: 25 mg via ORAL
  Filled 2013-08-29: qty 1

## 2013-08-29 MED ORDER — CHLORDIAZEPOXIDE HCL 25 MG PO CAPS: 25.0000 mg | ORAL_CAPSULE | Freq: Every day | ORAL | Status: DC

## 2013-08-29 MED ORDER — GI COCKTAIL ~~LOC~~
30.0000 mL | Freq: Three times a day (TID) | ORAL | Status: DC | PRN
  Administered 2013-08-29: 30 mL via ORAL
  Filled 2013-08-29: qty 30

## 2013-08-29 MED ORDER — CHLORDIAZEPOXIDE HCL 25 MG PO CAPS: 25.0000 mg | ORAL_CAPSULE | Freq: Four times a day (QID) | ORAL | Status: DC | PRN

## 2013-08-29 MED ORDER — SUCRALFATE 1 G PO TABS
1.0000 g | ORAL_TABLET | Freq: Three times a day (TID) | ORAL | Status: DC
  Administered 2013-08-29 – 2013-08-31 (×8): 1 g via ORAL
  Filled 2013-08-29 (×14): qty 1

## 2013-08-29 MED ORDER — CHLORDIAZEPOXIDE HCL 25 MG PO CAPS
25.0000 mg | ORAL_CAPSULE | Freq: Four times a day (QID) | ORAL | Status: AC
  Administered 2013-08-29 (×4): 25 mg via ORAL
  Filled 2013-08-29 (×4): qty 1

## 2013-08-29 NOTE — H&P (Signed)
Psychiatric Admission Assessment Adult  Patient Identification:  Elaine Warner Date of Evaluation:  08/29/2013 Chief Complaint:  polysubstance dependence History of Present Illness:: 23 Y/O female who states she is using Benzodiazepines, opioid, tramadol. She was using  cocaine and alcohol  up to a month ago.She has been using since 23 Y/O. States she suffered anxiety, and depression, and that she was given Zoloft, and Klonopin. States the Klonopin got to be a habit, and the use t got out of control. 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 She had side pain and was introduced to the opioids. She was introduced to cocaine at her business. .she states she wants to get detox and pursue outpatient treatment. Does not want to go to a residential treatment center any more.  Elements:  Location:  in patient. Quality:  unable to function. Severity:  severe. Timing:  every day. Duration:  last several years. Context:  polysubstance dependence, underlying anxiety . Associated Signs/Synptoms: Depression Symptoms:  Denies (Hypo) Manic Symptoms:  Denies Anxiety Symptoms:  Excessive Worry, Panic Symptoms, Obsessive Compulsive Symptoms:   has to do things a certain way, Psychotic Symptoms:  None PTSD Symptoms: Had a traumatic exposure:  abuse Re-experiencing:  Flashbacks Intrusive Thoughts Nightmares  Psychiatric Specialty Exam: Physical Exam  Review of Systems  Constitutional: Negative.   HENT: Negative.   Eyes: Negative.   Respiratory:       Pack a day  Cardiovascular: Negative.   Gastrointestinal: Negative.   Genitourinary: Negative.   Musculoskeletal: Positive for myalgias.  Skin: Negative.   Neurological: Positive for tremors.  Endo/Heme/Allergies: Negative.   Psychiatric/Behavioral: Positive for substance abuse. The patient is nervous/anxious.     Blood pressure 100/65, pulse 99, temperature 97.7 F (36.5 C), temperature source Oral, resp.  rate 16, height 5' 3"  (1.6 m), weight 51.256 kg (113 lb).Body mass index is 20.02 kg/(m^2).  General Appearance: Fairly Groomed  Engineer, water::  Minimal  Speech:  Clear and Coherent and not spontaneous  Volume:  Decreased  Mood:  Anxious and worried  Affect:  Restricted  Thought Process:  Coherent and Goal Directed  Orientation:  Full (Time, Place, and Person)  Thought Content:  symptoms, worries, concerns  Suicidal Thoughts:  No  Homicidal Thoughts:  No  Memory:  Immediate;   Fair Recent;   Fair Remote;   Fair  Judgement:  Fair  Insight:  Present and superficial  Psychomotor Activity:  Restlessness  Concentration:  Fair  Recall:  Fair  Akathisia:  No  Handed:    AIMS (if indicated):     Assets:  Desire for Improvement Social Support  Sleep:  Number of Hours: 5.75    Past Psychiatric History: Diagnosis:  Hospitalizations: Denies  Outpatient Care: Dr. Orpah Cobb for therapy, [facility]le Folks  Substance Abuse Care: Denies  Self-Mutilation:Denies  Suicidal Attempts:Denies  Violent Behaviors:Denies   Past Medical History:   Past Medical History  Diagnosis Date  . Anorexia   . Depression     Allergies:   Allergies  Allergen Reactions  . Lactose Intolerance (Gi) Other (See Comments)    bloating   PTA Medications: Prescriptions prior to admission  Medication Sig Dispense Refill  . ALPRAZolam (XANAX) 1 MG tablet Take 1-2 mg by mouth daily.      . clonazePAM (KLONOPIN) 0.5 MG tablet Take 3-5 mg by mouth 4 (four) times daily.       Marland Kitchen HYDROcodone-acetaminophen (LORTAB) 7.5-500 MG per tablet Take 2 tablets by mouth  every 4 (four) hours.      . OxyCODONE HCl (OXYCONTIN PO) Take 2 tablets by mouth daily.      Marland Kitchen oxyCODONE-acetaminophen (PERCOCET) 10-325 MG per tablet Take 2 tablets by mouth daily.      . traMADol (ULTRAM) 50 MG tablet Take 50-100 mg by mouth 2 (two) times daily.      Marland Kitchen levonorgestrel (MIRENA) 20 MCG/24HR IUD 1 each by Intrauterine route once.        Previous  Psychotropic Medications:  Medication/Dose  Klonopin 0.5 BID (prescribed)  Celexa 20 daily             Substance Abuse History in the last 12 months:  yes  Consequences of Substance Abuse: Withdrawal Symptoms:   Diaphoresis Diarrhea Nausea Tremors aches, pains   Social History:  reports that she has been smoking.  She does not have any smokeless tobacco history on file. She reports that she drinks alcohol. She reports that she uses illicit drugs. Additional Social History: Pain Medications: SEE MAR Prescriptions: SEE MAR Over the Counter: SEE MAR History of alcohol / drug use?: Yes Longest period of sobriety (when/how long): does not use etoh at present Negative Consequences of Use: Personal relationships;Financial;Legal Withdrawal Symptoms: Agitation;Fever / Chills;Irritability Name of Substance 1: Benzo's-Xanax and Klonopin  1 - Age of First Use: 23 yrs old  1 - Amount (size/oz): Xanax-varies; Klonopin-0.59m 1 - Frequency: patient takes the xanax 3-4x's per week; Klonopin patient takes daily 1 - Duration: ongoing since age 89 1- Last Use / Amount: this morning at 7am Name of Substance 2: Opiates-Oxycotin, Rox's, Tramadal, Hydrocodin, and Vicodin 2 - Age of First Use: 23yrs old  2 - Amount (size/oz): "I take them like M&M's" 2 - Frequency: daily  2 - Duration: on-going since age 23 - Last Use / Amount: this morning at 7am Name of Substance 3: Cocaine  3 - Age of First Use: 23yrs old  3 - Amount (size/oz): 1 gram 3 - Frequency: "couple times per month" 3 - Duration: on-going  3 - Last Use / Amount: 1 month ago              Current Place of Residence:   Place of Birth:   Family Members: Marital Status:  Single Children: none  Sons:  Daughters: Relationships: Education:  past HS, 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 Legal History:Denies Hobbies/Interests:  Family History:  No family history on file., Addiction, Anxiety  Results for orders placed during the hospital encounter of 08/28/13 (from the past 72 hour(s))  ACETAMINOPHEN LEVEL     Status: None   Collection Time    08/28/13 10:00 AM      Result Value Range   Acetaminophen (Tylenol), Serum <15.0  10 - 30 ug/mL   Comment:            THERAPEUTIC CONCENTRATIONS VARY     SIGNIFICANTLY. A RANGE OF 10-30     ug/mL MAY BE AN EFFECTIVE     CONCENTRATION FOR MANY PATIENTS.     HOWEVER, SOME ARE BEST TREATED     AT CONCENTRATIONS OUTSIDE THIS     RANGE.     ACETAMINOPHEN CONCENTRATIONS     >150 ug/mL AT 4 HOURS AFTER     INGESTION AND >50 ug/mL AT 12     HOURS AFTER INGESTION ARE     OFTEN  ASSOCIATED WITH TOXIC     REACTIONS.  CBC     Status: None   Collection Time    08/28/13 10:00 AM      Result Value Range   WBC 10.5  4.0 - 10.5 K/uL   RBC 4.19  3.87 - 5.11 MIL/uL   Hemoglobin 13.7  12.0 - 15.0 g/dL   HCT 39.7  36.0 - 46.0 %   MCV 94.7  78.0 - 100.0 fL   MCH 32.7  26.0 - 34.0 pg   MCHC 34.5  30.0 - 36.0 g/dL   RDW 12.8  11.5 - 15.5 %   Platelets 232  150 - 400 K/uL  COMPREHENSIVE METABOLIC PANEL     Status: None   Collection Time    08/28/13 10:00 AM      Result Value Range   Sodium 139  137 - 147 mEq/L   Potassium 4.3  3.7 - 5.3 mEq/L   Chloride 99  96 - 112 mEq/L   CO2 27  19 - 32 mEq/L   Glucose, Bld 94  70 - 99 mg/dL   BUN 7  6 - 23 mg/dL   Creatinine, Ser 0.60  0.50 - 1.10 mg/dL   Calcium 9.4  8.4 - 10.5 mg/dL   Total Protein 7.2  6.0 - 8.3 g/dL   Albumin 4.4  3.5 - 5.2 g/dL   AST 15  0 - 37 U/L   ALT 8  0 - 35 U/L   Alkaline Phosphatase 66  39 - 117 U/L   Total Bilirubin 0.9  0.3 - 1.2 mg/dL   GFR calc non Af Amer >90  >90 mL/min   GFR calc Af Amer >90  >90 mL/min   Comment: (NOTE)     The eGFR has been calculated using the CKD EPI equation.     This  calculation has not been validated in all clinical situations.     eGFR's persistently <90 mL/min signify possible Chronic Kidney     Disease.  ETHANOL     Status: None   Collection Time    08/28/13 10:00 AM      Result Value Range   Alcohol, Ethyl (B) <11  0 - 11 mg/dL   Comment:            LOWEST DETECTABLE LIMIT FOR     SERUM ALCOHOL IS 11 mg/dL     FOR MEDICAL PURPOSES ONLY  SALICYLATE LEVEL     Status: Abnormal   Collection Time    08/28/13 10:00 AM      Result Value Range   Salicylate Lvl <3.0 (*) 2.8 - 20.0 mg/dL  URINE RAPID DRUG SCREEN (HOSP PERFORMED)     Status: Abnormal   Collection Time    08/28/13 11:26 AM      Result Value Range   Opiates POSITIVE (*) NONE DETECTED   Cocaine NONE DETECTED  NONE DETECTED   Benzodiazepines POSITIVE (*) NONE DETECTED   Amphetamines NONE DETECTED  NONE DETECTED   Tetrahydrocannabinol NONE DETECTED  NONE DETECTED   Barbiturates NONE DETECTED  NONE DETECTED   Comment:            DRUG SCREEN FOR MEDICAL PURPOSES     ONLY.  IF CONFIRMATION IS NEEDED     FOR ANY PURPOSE, NOTIFY LAB     WITHIN 5 DAYS.                LOWEST DETECTABLE LIMITS     FOR URINE  DRUG SCREEN     Drug Class       Cutoff (ng/mL)     Amphetamine      1000     Barbiturate      200     Benzodiazepine   599     Tricyclics       357     Opiates          300     Cocaine          300     THC              50  POCT PREGNANCY, URINE     Status: None   Collection Time    08/28/13 11:33 AM      Result Value Range   Preg Test, Ur NEGATIVE  NEGATIVE   Comment:            THE SENSITIVITY OF THIS     METHODOLOGY IS >24 mIU/mL   Psychological Evaluations:  Assessment:   DSM5:  Schizophrenia Disorders:  none Obsessive-Compulsive Disorders:  none Trauma-Stressor Disorders:  Posttraumatic Stress Disorder (309.81) Substance/Addictive Disorders:  Opioid Disorder - Moderate (304.00), Benzodiazepine use disorder, cocaine use disorder Depressive Disorders:  Major  Depressive Disorder - Mild (296.21)  AXIS I:  Anxiety Disorder NOS, Post Traumatic Stress Disorder and Substance Induced Mood Disorder AXIS II:  Deferred AXIS III:   Past Medical History  Diagnosis Date  . Anorexia   . Depression    AXIS IV:  other psychosocial or environmental problems AXIS V:  41-50 serious symptoms  Treatment Plan/Recommendations:  Supportive approach/coping skills/relapse prevention                                                                 Detox Librium/clonidine                                                                 Reassess and address the comorbidities  Treatment Plan Summary: Daily contact with patient to assess and evaluate symptoms and progress in treatment Medication management Current Medications:  Current Facility-Administered Medications  Medication Dose Route Frequency Provider Last Rate Last Dose  . acetaminophen (TYLENOL) tablet 650 mg  650 mg Oral Q6H PRN Laverle Hobby, PA-C      . alum & mag hydroxide-simeth (MAALOX/MYLANTA) 200-200-20 MG/5ML suspension 30 mL  30 mL Oral Q4H PRN Laverle Hobby, PA-C      . cloNIDine (CATAPRES) tablet 0.1 mg  0.1 mg Oral QID Laverle Hobby, PA-C   0.1 mg at 08/28/13 2249   Followed by  . [START ON 08/31/2013] cloNIDine (CATAPRES) tablet 0.1 mg  0.1 mg Oral BH-qamhs Spencer E Simon, PA-C       Followed by  . [START ON 09/03/2013] cloNIDine (CATAPRES) tablet 0.1 mg  0.1 mg Oral QAC breakfast Laverle Hobby, PA-C      . dicyclomine (BENTYL) tablet 20 mg  20 mg Oral Q6H PRN Laverle Hobby, PA-C      . hydrOXYzine (ATARAX/VISTARIL)  tablet 25 mg  25 mg Oral Q6H PRN Laverle Hobby, PA-C   25 mg at 08/28/13 2249  . loperamide (IMODIUM) capsule 2-4 mg  2-4 mg Oral PRN Laverle Hobby, PA-C      . magnesium hydroxide (MILK OF MAGNESIA) suspension 30 mL  30 mL Oral Daily PRN Laverle Hobby, PA-C      . methocarbamol (ROBAXIN) tablet 500 mg  500 mg Oral Q8H PRN Laverle Hobby, PA-C   500 mg at 08/28/13  2248  . naproxen (NAPROSYN) tablet 500 mg  500 mg Oral BID PRN Laverle Hobby, PA-C   500 mg at 08/28/13 2249  . nicotine (NICODERM CQ - dosed in mg/24 hours) patch 21 mg  21 mg Transdermal Q0600 Nicholaus Bloom, MD   21 mg at 08/29/13 0836  . ondansetron (ZOFRAN-ODT) disintegrating tablet 4 mg  4 mg Oral Q6H PRN Laverle Hobby, PA-C      . traZODone (DESYREL) tablet 50 mg  50 mg Oral QHS,MR X 1 Spencer E Simon, PA-C   50 mg at 08/28/13 2248    Observation Level/Precautions:  15 minute checks  Laboratory:  As per the ED  Psychotherapy:  Individual/group  Medications:  Librium/clonidine detox  Consultations:    Discharge Concerns:    Estimated LOS: 3-5 days  Other:     I certify that inpatient services furnished can reasonably be expected to improve the patient's condition.   Evetta Renner A 1/7/20158:53 AM

## 2013-08-29 NOTE — Progress Notes (Signed)
NUTRITION ASSESSMENT  Pt identified as at risk on the Malnutrition Screen Tool  INTERVENTION: 1. Educated patient on the importance of nutrition and encouraged intake of food and beverages. 2. Discussed weight goals. 3. Supplements: Ensure Complete po BID, each supplement provides 350 kcal and 13 grams of protein And MVI daily  NUTRITION DIAGNOSIS: Unintentional weight loss related to sub-optimal intake as evidenced by pt report.   Goal: Pt to meet >/= 90% of their estimated nutrition needs.  Monitor:  PO intake  Assessment:  Patient admitted opiate and benzo detox.  Reports a hx of anorexia nervosa since age 23.  UBW 110-118 lbs.  Current weight wnl for height.  "Not hungry, for years".  Generally eats one meal per day or "picks like a bird" and drinks juice.  Currently is eating only bites.  Relates problems with stomach pain.  Maalox has been ordered.  Receptive to Ensure.  Reports that she continues to see a therapist for the eating disorder.  23 y.o. female  Height: Ht Readings from Last 1 Encounters:  08/29/13 5\' 3"  (1.6 m)    Weight: Wt Readings from Last 1 Encounters:  08/29/13 113 lb (51.256 kg)    Weight Hx: Wt Readings from Last 10 Encounters:  08/29/13 113 lb (51.256 kg)    BMI:  Body mass index is 20.02 kg/(m^2). Pt meets criteria for normal based on current BMI.  Estimated Nutritional Needs: Kcal: 25-30 kcal/kg Protein: > 1 gram protein/kg Fluid: 1 ml/kcal  Diet Order: General Pt is also offered choice of unit snacks mid-morning and mid-afternoon.  Pt is eating as desired.   Lab results and medications reviewed.   Oran ReinLaura Kaylyn Garrow, RD, LDN Clinical Inpatient Dietitian Pager:  [phone] Weekend and after hours pager:  [phone]
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
## 2013-08-29 NOTE — Progress Notes (Signed)
Patient ID: Elaine Warner, female   DOB: Dec 03, 1990, 23 y.o.   MRN: [id number] She has been up and to groups interacting with pwers and staff. Self inventory: depression 3. Hopelessness 1, denies SI thoughts.  She requested and received prn medication for pain and withdrawal symptoms this AM and it was effective.

## 2013-08-29 NOTE — B[facility] Suicide Risk Assessment (Signed)
B[facility] INPATIENT: Family/Significant Other Suicide Prevention Education  Suicide Prevention Education:  Education Completed; No one has been identified by the patient as the family member/significant other with whom the patient will be residing, and identified as the person(s) who will aid the patient in the event of a mental health crisis (suicidal ideations/suicide attempt).   Pt did not c/o SI at admission, nor have they endorsed SI during their stay here. SPE not required. SPI pamphlet provided to pt and she was encouraged to share information with support network, ask questions, and talk about any concerns relating to SPE.   The Sherwin-WilliamsHeather Smart, LCSWA 08/29/2013 12:21 PM

## 2013-08-29 NOTE — [facility] Group Notes (Signed)
[facility] LCSW [facility] Discharge Planning Group Note   08/29/2013 10:58 AM  Participation Quality:  DID NOT ATTEND-PT IN BED SLEEPING AND REFUSED TO ATTEND GROUP  Smart, Veronica Fretz LCSWA 08/29/2013 10:58 AM

## 2013-08-29 NOTE — Tx Team (Signed)
Interdisciplinary Treatment Plan Update (Adult)  Date: 08/29/2013   Time Reviewed: 12:00 PM  Progress in Treatment:  Attending groups: No.  Participating in groups: No.   Taking medication as prescribed: Yes  Tolerating medication: Yes  Family/Significant othe contact made: No. SPE not required for this pt.  Patient understands diagnosis: Yes, AEB seeking treatment for opiate/benzo detox, and mood stabilization.  Discussing patient identified problems/goals with staff: Yes  Medical problems stabilized or resolved: Yes  Denies suicidal/homicidal ideation: Yes during admission and self report.  Patient has not harmed self or Others: Yes  New problem(s) identified:  Discharge Plan or Barriers: Pt currently not attending d/c planning group. CSW assessing for appropriate referrals.  Additional comments: Elaine Warner is an 23 y.o. female. Per ED notes: "Patient reports she wants detox from benzodiazepines and narcotics. She states 2 days ago she went to [facility] however she was so intoxicated they were concerned about her heart rate. [facility] she was seen but left after one to 2 hours. It is not clear what the physical problem they were concerned about was. They report they were told she needed "medical clearance" before she can go back to [facility]. She states she was highly intoxicated that day. She states she took her last Xanax 1 mg at 7 AM. She states she only takes Xanax 3 times a week. However she is prescribed clonazepam 0.5 mg twice a day. She states she had it last filled on December 27, #60 tablets and she only has 3 left. She also states she takes 20-30 mg of Percocet, Roxicodone, oxycodone daily. She states she has been using since she was 23 years old. She states she's never been to detox or rehabilitation in the past. She states she has been going to NA about 2 months.  Patient denies feeling depressed, she also denies suicidal or homicidal  ideation".  Reason for Continuation of Hospitalization: Clonidine/librium taper-withdrawals Mood stabilization Medication management  Estimated length of stay: 2-3 days  For review of initial/current patient goals, please see plan of care.  Attendees:  Patient:    Family:    Physician: Geoffery LyonsIrving Lugo MD 08/29/2013 12:00 PM   Nursing: Lupita LeashDonna RN  08/29/2013 12:00 PM   Clinical Social Worker The Sherwin-WilliamsHeather Smart, LCSWA  08/29/2013 12:00 PM   Other: Maureen RalphsVivian RN 08/29/2013 12:00 PM   Other:    Other: Darden DatesJennifer C. Nurse CM 08/29/2013 12:00 PM   Other:    Scribe for Treatment Team:  Trula SladeHeather Smart LCSWA 08/29/2013 12:00 PM

## 2013-08-29 NOTE — Progress Notes (Signed)
Recreation Therapy Notes   Date: 01.07.2014 Time: 2:45pm Location: [address] Dayroom   Group Topic: Communication, Team Building, Problem Solving  Goal Area(s) Addresses:  Patient will effectively work with peer towards shared goal.  Patient will identify skill used to make activity successful.  Patient will identify how skills used during activity can be used to reach post d/c goals.   Behavioral Response: Appropriate   Intervention: Problem Solving Activitiy  Activity: Life Boat. Patients were given a scenario about being on a sinking yacht. Patients were informed the yacht included 15 guest, 8 of which could be placed on the life boat, along with all group members. Individuals on guest list were of varying socioeconomic classes such as a Education officer, museumriest, Materials engineerresident Obama, MidwifeBus Driver, Tree surgeonTeacher and Chef.   Education: Pharmacist, communityocial Skills, Discharge Planning   Education Outcome: Acknowledges Education  Clinical Observations/Feedback: Patient actively engaged in group session, voicing her opinion and debating with peers appropriately. Patient made no contributions to group discussion, but appeared to actively listen as she maintained appropriate eye contact with speaker.    Marykay Lexenise L Peace Jost, LRT/CTRS  Blayke Cordrey L 08/29/2013 5:03 PM

## 2013-08-29 NOTE — Progress Notes (Signed)
Adult Psychoeducational Group Note  Date:  08/29/2013 Time:  9:37 PM  Group Topic/Focus:  NA group  Participation Level:  Active  Participation Quality:  Appropriate  Affect:  Appropriate  Cognitive:  Alert  Insight: Appropriate  Engagement in Group:  Engaged  Modes of Intervention:  Discussion  Additional Comments:    Elaine HailstoneCOOKE, Elaine Sobol R 08/29/2013, 9:37 PM

## 2013-08-29 NOTE — BHH Suicide Risk Assessment (Signed)
Suicide Risk Assessment  Admission Assessment     Nursing information obtained from:    Demographic factors:    Current Mental Status:    Loss Factors:    Historical Factors:    Risk Reduction Factors:     CLINICAL FACTORS:   Depression:   Comorbid alcohol abuse/dependence Impulsivity Alcohol/Substance Abuse/Dependencies  COGNITIVE FEATURES THAT CONTRIBUTE TO RISK:  Closed-mindedness Polarized thinking Thought constriction (tunnel vision)    SUICIDE RISK:   Moderate:    PLAN OF CARE: Supportive approach/coping skills/relapse prevention                               Detox as needed                               Reassess and address the co morbidities  I certify that inpatient services furnished can reasonably be expected to improve the patient's condition.  Suhail Peloquin A 08/29/2013, 1:00 PM

## 2013-08-29 NOTE — BHH Group Notes (Signed)
[facility] LCSW Group Therapy  08/29/2013 2:51 PM  Type of Therapy:  Group Therapy  Participation Level:  Minimal  Participation Quality:  Drowsy  Affect:  Depressed, Flat and Lethargic  Cognitive:  Lacking  Insight:  Limited  Engagement in Therapy:  Lacking  Modes of Intervention:  Confrontation, Discussion, Education, Exploration, Problem-solving, Rapport Building, Socialization and Support  Summary of Progress/Problems: Emotion Regulation: This group focused on both positive and negative emotion identification and allowed group members to process ways to identify feelings, regulate negative emotions, and find healthy ways to manage internal/external emotions. Group members were asked to reflect on a time when their reaction to an emotion led to a negative outcome and explored how alternative responses using emotion regulation would have benefited them. Group members were also asked to discuss a time when emotion regulation was utilized when a negative emotion was experienced. Marthenia RollingMarketa was inattentive and disengaged throughout today's therapy group due to extreme drowsiness. She stated that she gets high to numb emotions before even feeling anything so she was unable to describe how depression and anxiety feel to her. Jouri fell asleep and did not participate in group after this. At this time she does not appear to be making progress in the group setting.   Smart, Dekisha Mesmer LCSWA  08/29/2013, 2:51 PM

## 2013-08-29 NOTE — Progress Notes (Signed)
Adult Psychoeducational Group Note  Date:  08/29/2013 Time:  1:59 PM  Group Topic/Focus:  Personal Choices and Values:   The focus of this group is to help patients assess and explore the importance of values in their lives, how their values affect their decisions, how they express their values and what opposes their expression.  Participation Level:  Active  Participation Quality:  Attentive  Affect:  Flat  Cognitive:  Appropriate and Oriented  Insight: Good and Improving  Engagement in Group:  Engaged  Modes of Intervention:  Discussion, Education, Exploration and Support  Additional Comments:   Pt's were encouraged to identify ways they have been helped during this hospitalization. This pt stated, "This is the most I have ever talked. I realized I'm no better than anyone else". Reynolds BowlClement, Rox Mcgriff D 08/29/2013, 1:59 PM

## 2013-08-30 DIAGNOSIS — F321 Major depressive disorder, single episode, moderate: Secondary | ICD-10-CM

## 2013-08-30 MED ORDER — LUBIPROSTONE 24 MCG PO CAPS
24.0000 ug | ORAL_CAPSULE | Freq: Two times a day (BID) | ORAL | Status: DC
  Administered 2013-08-30 – 2013-08-31 (×3): 24 ug via ORAL
  Filled 2013-08-30 (×5): qty 1

## 2013-08-30 MED ORDER — LUBIPROSTONE 24 MCG PO CAPS: 24.0000 ug | ORAL_CAPSULE | Freq: Two times a day (BID) | ORAL | Status: DC

## 2013-08-30 NOTE — Progress Notes (Signed)
Patient ID: [name], female   DOB: August 08, 1991, 23 y.o.   MRN: [id number] Pt attended morning wellness group with RN at 9 am. Patient's goal for today is to prepare for discharge and work on her relationship with her husband. Pt would like to learn better coping skills for when she is down or stressed so she can learn to stop using substances/alcohol to cope with her problems.

## 2013-08-30 NOTE — Progress Notes (Signed)
Adult Psychoeducational Group Note  Date:  08/30/2013 Time:  2:21 PM  Group Topic/Focus:  Overcoming Stress:   The focus of this group is to define stress and help patients assess their triggers.  Participation Level:  Minimal  Participation Quality:  Attentive  Affect:  Appropriate  Cognitive:  Oriented  Insight: Improving  Engagement in Group:  Engaged  Modes of Intervention:  Discussion, Education and Support  Additional Comments:    Reynolds BowlClement, Orvill Coulthard D 08/30/2013, 2:21 PM

## 2013-08-30 NOTE — Progress Notes (Signed)
[facility] MD Progress Note  08/30/2013 4:18 PM Birgitta Uhlir  MRN:  [id number] Subjective:  Noe endorses that she is starting to feel better. She is trying to get her life back together. She is planning to go back home with boyfriend. She is planning to go back to work. She states she has dissociated from the people at work who would influence her relapse. She has not benzodiazepines or opioids  left and no contacts to get more. She is willing to pursue outpatient treatment once she is D/C. States she will have enough support and does not feel she needs to go to rehab. 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  HENT: Negative.   Eyes: Negative.   Respiratory: Negative.   Cardiovascular: Negative.   Gastrointestinal: Negative.   Genitourinary: Negative.   Musculoskeletal: Negative.   Skin:       Herpetic lesions, healing  Neurological: Negative.   Endo/Heme/Allergies: Negative.   Psychiatric/Behavioral: Positive for substance abuse. The patient is nervous/anxious.     Blood pressure 77/52, pulse 75, temperature 98.1 F (36.7 C), temperature source Oral, resp. rate 21, height 5\' 3"  (1.6 m), weight 51.256 kg (113 lb).Body mass index is 20.02 kg/(m^2).  General Appearance: Fairly Groomed  Patent attorney::  Fair  Speech:  Clear and Coherent  Volume:  Decreased  Mood:  Anxious and worried  Affect:   anxious, worried  Thought Process:  Coherent and Goal Directed  Orientation:  Full (Time, Place, and Person)  Thought Content:  symptoms, worries, concerns  Suicidal Thoughts:  No  Homicidal Thoughts:  No  Memory:  Immediate;   Fair Recent;   Fair Remote;   Fair  Judgement:  Fair  Insight:  Present  Psychomotor Activity:  Restlessness  Concentration:  Fair  Recall:  Fair  Akathisia:  No  Handed:    AIMS (if indicated):     Assets:  Desire for Improvement Social Support Vocational/Educational  Sleep:  Number of Hours: 5.5   Current Medications: Current Facility-Administered Medications  Medication Dose Route Frequency Provider Last Rate Last Dose  . acetaminophen (TYLENOL) tablet 650 mg  650 mg Oral Q6H PRN Kerry Hough, PA-C      . alum & mag hydroxide-simeth (MAALOX/MYLANTA) 200-200-20 MG/5ML suspension 30 mL  30 mL Oral Q4H PRN Kerry Hough, PA-C      . chlordiazePOXIDE (LIBRIUM) capsule 25 mg  25 mg Oral Q6H PRN Rachael Fee, MD      . Melene Muller ON 08/31/2013] chlordiazePOXIDE (LIBRIUM) capsule 25 mg  25 mg Oral BH-qamhs Rachael Fee, MD       Followed by  . [START ON 09/01/2013] chlordiazePOXIDE (LIBRIUM) capsule 25 mg  25 mg Oral Daily Rachael Fee, MD      . cloNIDine (CATAPRES) tablet 0.1 mg  0.1 mg Oral QID Kerry Hough, PA-C   0.1 mg at 08/29/13 2109   Followed by  . [START ON 08/31/2013] cloNIDine (CATAPRES) tablet 0.1 mg  0.1  mg Oral BH-qamhs Kerry Hough, PA-C       Followed by  . [START ON 09/03/2013] cloNIDine (CATAPRES) tablet 0.1 mg  0.1 mg Oral QAC breakfast Kerry Hough, PA-C      . dicyclomine (BENTYL) tablet 20 mg  20 mg Oral Q6H PRN Kerry Hough, PA-C   20 mg at 08/30/13 1111  . feeding supplement (ENSURE COMPLETE) (ENSURE COMPLETE) liquid 237 mL  237 mL Oral BID WC Jeoffrey Massed, RD   237 mL at 08/30/13 1330  . gi cocktail (Maalox,Lidocaine,Donnatal)  30 mL Oral TID PRN Rachael Fee, MD   30 mL at 08/29/13 1535  . hydrOXYzine  (ATARAX/VISTARIL) tablet 25 mg  25 mg Oral Q6H PRN Kerry Hough, PA-C   25 mg at 08/29/13 2325  . loperamide (IMODIUM) capsule 2-4 mg  2-4 mg Oral PRN Kerry Hough, PA-C      . lubiprostone (AMITIZA) capsule 24 mcg  24 mcg Oral BID WC Rachael Fee, MD   24 mcg at 08/30/13 1609  . magnesium hydroxide (MILK OF MAGNESIA) suspension 30 mL  30 mL Oral Daily PRN Kerry Hough, PA-C   30 mL at 08/30/13 1609  . methocarbamol (ROBAXIN) tablet 500 mg  500 mg Oral Q8H PRN Kerry Hough, PA-C   500 mg at 08/29/13 2109  . multivitamin with minerals tablet 1 tablet  1 tablet Oral Daily Jeoffrey Massed, RD   1 tablet at 08/30/13 3244  . naproxen (NAPROSYN) tablet 500 mg  500 mg Oral BID PRN Kerry Hough, PA-C   500 mg at 08/30/13 1609  . nicotine (NICODERM CQ - dosed in mg/24 hours) patch 21 mg  21 mg Transdermal Q0600 Rachael Fee, MD   21 mg at 08/30/13 0815  . ondansetron (ZOFRAN-ODT) disintegrating tablet 4 mg  4 mg Oral Q6H PRN Kerry Hough, PA-C      . sucralfate (CARAFATE) tablet 1 g  1 g Oral TID WC & HS Rachael Fee, MD   1 g at 08/30/13 1609  . traZODone (DESYREL) tablet 50 mg  50 mg Oral QHS,MR X 1 Spencer E Simon, PA-C   50 mg at 08/29/13 2325    Lab Results: No results found for this or any previous visit (from the past 48 hour(s)).  Physical Findings: AIMS: Facial and Oral Movements Muscles of Facial Expression: None, normal Lips and Perioral Area: None, normal Jaw: None, normal Tongue: None, normal,Extremity Movements Upper (arms, wrists, hands, fingers): None, normal Lower (legs, knees, ankles, toes): None, normal, Trunk Movements Neck, shoulders, hips: None, normal, Overall Severity Severity of abnormal movements (highest score from questions above): None, normal Incapacitation due to abnormal movements: None, normal Patient's awareness of abnormal movements (rate only patient's report): No Awareness, Dental Status Current problems with teeth and/or dentures?: No Does  patient usually wear dentures?: No  CIWA:  CIWA-Ar Total: 3 COWS:  COWS Total Score: 3  Treatment Plan Summary: Daily contact with patient to assess and evaluate symptoms and progress in treatment Medication management  Plan: Supportive approach/coping skills/relapse prevention           Complete the detox           Reassess and address the co morbidities  Medical Decision Making Problem Points:  Review of psycho-social stressors (1) Data Points:  Review of medication regiment & side effects (2)  I certify that inpatient services furnished can reasonably be expected to improve the  patient's condition.   Chijioke Lasser A 08/30/2013, 4:18 PM

## 2013-08-30 NOTE — Progress Notes (Signed)
Patient ID: [name], female   DOB: [birth year], 23 y.o.   MRN: [id number] Pt attended AA meeting.

## 2013-08-30 NOTE — Progress Notes (Signed)
D: pt bp has been running low throughout the day, clonidine held before dinner, but after dinner bp was rechecked 115/62, 1700 dose of clonidine given, 2200 not given. Pt complains of anxiety. Pt grandmother came by during visiting hours, along with her husband. Pt stated it was a good visit, she wishes she could go home with them. Denies si/hi/avh. denies pain at the moment. A: medications given. q 15 min safety checks. 1:1 time given R: pt remains safe on unit. No further complaints at this time.

## 2013-08-30 NOTE — [facility] Group Notes (Signed)
[facility] LCSW Group Therapy  08/30/2013 2:29 PM  Type of Therapy:  Group Therapy  Participation Level:  Minimal   Participation Quality:  Attentive  Affect:  Flat  Cognitive:  Alert and Oriented  Insight:  Improving  Engagement in Therapy:  Limited  Modes of Intervention:  Confrontation, Discussion, Education, Exploration, Rapport Building, Role-play, Socialization and Support  Summary of Progress/Problems:  Finding Balance in Life. Today's group focused on defining balance in one's own words, identifying things that can knock one off balance, and exploring healthy ways to maintain balance in life. Group members were asked to provide an example of a time when they felt off balance, describe how they handled that situation,and process healthier ways to regain balance in the future. Group members were asked to share the most important tool for maintaining balance that they learned [facility] and how they plan to apply this method after discharge. Elaine RollingMarketa was engaged and attentive throughout today's therapy group. She did not participate in group discussion unless asked a question directly by CSW. Elaine Warner actively listened as others participated in group discussion and shows some progress in the group setting. Elaine RollingMarketa stated that she plans to make a daily scheduled of activities when she leaves to help her maintain balance by "having specific things to accomplish each day and feeling like I have achieved that."     Smart, Viet Kemmerer LCSWA  08/30/2013, 2:29 PM

## 2013-08-30 NOTE — Progress Notes (Signed)
Patient ID: Elaine Warner, female   DOB: 1991/07/04, 23 y.o.   MRN: [id number] She has been up and to groups interacting with peers and staff. Self inventory: depression 1, hopelessness 1. Denies SI thoughts W/d's of chilling. Has not requested any prn medication. She has c/o weakness before lunch b/p taken and was low clonidine held fluids encouraged. After lunch b/p 110/64 and she said that she felt stronger and not dizzy.

## 2013-08-30 NOTE — Progress Notes (Signed)
Recreation Therapy Notes  Animal-Assisted Activity/Therapy (AAA/T) Program Checklist/Progress Notes Patient Eligibility Criteria Checklist & Daily Group note for Rec Tx Intervention  Date: 01.08.2014 Time: 2:45pm [address]   AAA/T Program Assumption of Risk Form signed by Patient/ or Parent Legal Guardian yes  Patient is free of allergies or sever asthma yes  Patient reports no fear of animals yes  Patient reports no history of cruelty to animals yes   Patient understands his/her participation is voluntary yes  Patient washes hands before animal contact yes  Patient washes hands after animal contact yes  Behavioral Response: Engaged, Appropriate   Education: Hand Washing, Appropriate Animal Interaction   Education Outcome: Acknowledges understanding   Clinical Observations/Feedback: Patient interacted appropriately with therapy dog team.   Jearl Klinefelterenise L Illias Pantano, LRT/CTRS  Jayant Kriz L 08/30/2013 4:59 PM

## 2013-08-31 DIAGNOSIS — F1920 Other psychoactive substance dependence, uncomplicated: Principal | ICD-10-CM

## 2013-08-31 DIAGNOSIS — F1120 Opioid dependence, uncomplicated: Principal | ICD-10-CM

## 2013-08-31 MED ORDER — HYDROXYZINE HCL 25 MG PO TABS: ORAL_TABLET | ORAL | Status: DC

## 2013-08-31 MED ORDER — HYDROXYZINE HCL 25 MG PO TABS
25.0000 mg | ORAL_TABLET | Freq: Three times a day (TID) | ORAL | Status: DC
  Filled 2013-08-31 (×3): qty 9

## 2013-08-31 MED ORDER — LEVONORGESTREL 20 MCG/24HR IU IUD: 1.0000 | INTRAUTERINE_SYSTEM | Freq: Once | INTRAUTERINE | Status: DC

## 2013-08-31 MED ORDER — TRAZODONE HCL 50 MG PO TABS: 50.0000 mg | ORAL_TABLET | Freq: Every evening | ORAL | Status: DC | PRN

## 2013-08-31 NOTE — Progress Notes (Signed)
Recreation Therapy Notes  INPATIENT RECREATION THERAPY ASSESSMENT  Patient Stressors: Family, Work,  Other: Change  Coping Skills: Arguments, Substance Abuse, Other: Shopping  Leisure Interests: Financial controllerArts & Crafts, Exercise, Listening to Music, Movies,  Reading, Shopping, Social Activities, Sports, Walking, Writing, Other: Surveyor, miningcrapbooking  Personal Challenges: Communication, Expressing Yourself, Stress Management, Substance Abuse, Time Management, Trusting Others,   Patient indicated she does not have a physical disability that would prevent participating in recreation therapy group sessions.  Patient listed the following strengths: NONE LISTED  Patient indicated she would like to change the following about herself: The way I express feeling and to be less introverted.   Patient listed the following current recreation interests: Shop, [facility]        Patient goal for hospitalization: To Detox  Patient city [place] residence[place]  [place] Schlauch L [place], LRT/CTRS  Jearl KlinefelterBlanchfield, Jeanelle Dake L 08/31/2013 9:09 AM

## 2013-08-31 NOTE — Progress Notes (Signed)
Patient ID: Elaine CancelMarketa Addis, female   DOB: 1991/08/10, 23 y.o.   MRN: [id number] Patient denies si/hi/avh. Pt verbalizes understanding of discharge instructions, prescriptions and follow up appts.she also got sample medications. Pt signed for her belongings from locker. Pt was walked to the lobby and was transported by family(grandmother).

## 2013-08-31 NOTE — [facility] Group Notes (Signed)
[facility]   08/31/2013 9:18 AM  Participation Quality:  Appropriate   Mood/Affect:  Appropriate  Depression Rating:  0  Anxiety Rating:  0  Thoughts of Suicide:  No Will you contract for safety?   NA  Current AVH:  No  Plan for Discharge/Comments:  Pt hoping to d/c before lunch today. She will follow up with [facility]len[facility] for med [facility] Dr[facility] Lorina RabonBanawa for therapy. She will return home to live with her grandparents at d/c.   Transportation Means: grandmother   Supports: grandparents and Stage managerfiance   Smart, Systems developerHeather  LCSWA

## 2013-08-31 NOTE — Progress Notes (Signed)
[facility] Group Notes:  (Nursing/MHT/Case Management/Adjunct)  Date:  08/30/2013  Time:  2100 Type of Therapy:  wrap up group  Participation Level:  Active  Participation Quality:  Appropriate, Attentive and Sharing  Affect:  Appropriate  Cognitive:  Alert and Appropriate  Insight:  Appropriate  Engagement in Group:  Engaged  Modes of Intervention:  Clarification, Education and Support  Summary of Progress/Problems: Pt reports feeling happy today and attributes it to having no drugs in her system.   Shelah LewandowskySquires, Camela Wich Carol 08/31/2013, 3:20 AM

## 2013-08-31 NOTE — [facility] Suicide Risk Assessment (Signed)
Suicide Risk Assessment  Discharge Assessment     Demographic Factors:  Adolescent or young adult and Caucasian  Mental Status Per Nursing Assessment::   On Admission:  NA (denies any si/hi/av. )  Current Mental Status by Physician: In full contact with reality. There are no suicidal ideas, plans or intent. Mood is euthymic, affect is appropriate. There are no active S/S of withdrawal. She is willing and motivated to pursue outpatient therapy   Loss Factors: NA  Historical Factors: NA  Risk Reduction Factors:   Employed, Living with another person, especially a relative and Positive social support  Continued Clinical Symptoms:  Alcohol/Substance Abuse/Dependencies  Cognitive Features That Contribute To Risk:  Closed-mindedness Polarized thinking Thought constriction (tunnel vision)    Suicide Risk:  Minimal: No identifiable suicidal ideation.  Patients presenting with no risk factors but with morbid ruminations; may be classified as minimal risk based on the severity of the depressive symptoms  Discharge Diagnoses:   AXIS I:  Polysubstance Dependence including opioids, Substance Induced Mood DIsorder AXIS II:  Deferred AXIS III:   Past Medical History  Diagnosis Date  . Anorexia   . Depression    AXIS IV:  other psychosocial or environmental problems AXIS V:  61-70 mild symptoms  Plan Of Care/Follow-up recommendations:  Activity:  as torated Diet:  regular Continue outpatient treatment  Is patient on multiple antipsychotic therapies at discharge:  No   Has Patient had three or more failed trials of antipsychotic monotherapy by history:  No  Recommended Plan for Multiple Antipsychotic Therapies: NA  Dene Nazir A 08/31/2013, 11:16 AM

## 2013-08-31 NOTE — Discharge Summary (Signed)
Physician Discharge Summary Note  Patient:  Elaine Warner is an 23 y.o., female MRN:  [id number] DOB:  12-03-1990 Patient phone:  [phone] (home)  Patient address:   [address] [place] [address],   Date of Admission:  08/28/2013 Date of Discharge: 08/31/13  Reason for Admission: Drug detox  Discharge Diagnoses: Active Problems:   Substance induced mood disorder   Polysubstance dependence including opioid type drug, continuous use  Review of Systems  Constitutional: Negative.   HENT: Negative.   Eyes: Negative.   Respiratory: Negative.   Cardiovascular: Negative.   Gastrointestinal: Negative.   Genitourinary: Negative.   Musculoskeletal: Negative.   Skin: Negative.   Neurological: Negative.   Endo/Heme/Allergies: Negative.   Psychiatric/Behavioral: Positive for substance abuse (Polysubstance dependence). Negative for depression, suicidal ideas, hallucinations and memory loss. The patient is nervous/anxious (Stabilized with medication prior to discharge) and has insomnia (Stabilized with medication prior to discharge).    DSM5: Schizophrenia Disorders:  NA Obsessive-Compulsive Disorders:  NA Trauma-Stressor Disorders:  NA Substance/Addictive Disorders:  Polysubstance dependence including opioid type drug, continuous use  Depressive Disorders:  Substance induced mood disorder  Axis Diagnosis:  AXIS I:  Polysubstance dependence including opioid type drug, continuous use, Substance induced mood disorder AXIS II:  Deferred AXIS III:   Past Medical History  Diagnosis Date  . Anorexia   . Depression    AXIS IV:  other psychosocial or environmental problems and Polysubstance dependence, chronic AXIS V:  62  Level of Care:  OP  Hospital Course:  23 Y/O female who states she is using Benzodiazepines, opioid, tramadol. She was using cocaine and alcohol up to a month ago.She has been using since 23 Y/O. States she suffered anxiety, and depression, and that she was  given Zoloft, and Klonopin. States the Klonopin got to be a habit, and the use t got out of control. 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 She had side pain and was introduced to the opioids. She was introduced to cocaine at her business. .she states she wants to get detox and pursue outpatient treatment. Does not want to go to a residential treatment center any more.  While a patient is this hospital, [facility] received both clonidine and Librium detoxification treatment protocols to re-stabilized her systems of both Benzodiazepine and other drug intoxications. She was also enrolled in group counseling sessions and activities where she was counseled, taught and learned coping skills that should help her after discharge to cope better and manage hetr substance abuse issues to maintain longer sobriety.  She also received medication management and monitoring for his other depressive mood/anxiety symptoms. She was ordered and received, Hydroxyzine 25 mg tid for anxiety and Trazodone 50 mg Q bedtime for sleep. Elaine Warner presented no other health issues that required treatment and or monitoring. However she was maintained on her birth control method Mirena as ordered. She was monitored closely for any potential problems that may arise as a result of her treatment.She tolerated her treatment regimen without any significant adverse effects and or reactions reported.   Elaine Warner has successfully completed her detoxifcation treatments. She is currently being discharged to the home of her grand-parents for further psychiatric treatment on an outpatient basis. And for medication management and routine psychiatric care, she will follow-up care at the [facility] here in [facility] . And for counseling services, she will receiving this service at [facility] DBT treatment with Dr. Lilian Kapur. The addresses, dates, times and contact information  for all these appointments provided for  patient in writing.  Upon discharge, patient adamantly denies any suicidal, homicidal ideations,delusional thought, auditory, visual hallucinations and or withdrawal symptoms. She received 4 days worth supply samples of her [facility] medications. Transportation per family.  Consults:  psychiatry  Significant Diagnostic Studies:  labs: CBC with diff, CMP, UDS, Toxicology tests, U/A  Discharge Vitals:   Blood pressure 100/60, pulse 110, temperature 97.8 F (36.6 C), temperature source Oral, resp. rate 16, height 5\' 3"  (1.6 m), weight 51.256 kg (113 lb). Body mass index is 20.02 kg/(m^2). Lab Results:   No results found for this or any previous visit (from the past 72 hour(s)).  Physical Findings: AIMS: Facial and Oral Movements Muscles of Facial Expression: None, normal Lips and Perioral Area: None, normal Jaw: None, normal Tongue: None, normal,Extremity Movements Upper (arms, wrists, hands, fingers): None, normal Lower (legs, knees, ankles, toes): None, normal, Trunk Movements Neck, shoulders, hips: None, normal, Overall Severity Severity of abnormal movements (highest score from questions above): None, normal Incapacitation due to abnormal movements: None, normal Patient's awareness of abnormal movements (rate only patient's report): No Awareness, Dental Status Current problems with teeth and/or dentures?: No Does patient usually wear dentures?: No  CIWA:  CIWA-Ar Total: 3 COWS:  COWS Total Score: 1  Psychiatric Specialty Exam: See Psychiatric Specialty Exam and Suicide Risk Assessment completed by Attending Physician prior to discharge.  Discharge destination:  Home  Is patient on multiple antipsychotic therapies at discharge:  No   Has Patient had three or more failed trials of antipsychotic monotherapy by history:  No  Recommended Plan for Multiple Antipsychotic Therapies: NA     Medication List    STOP taking these medications       ALPRAZolam 1 MG tablet   Commonly known as:  XANAX     clonazePAM 0.5 MG tablet  Commonly known as:  KLONOPIN     HYDROcodone-acetaminophen 7.5-500 MG per tablet  Commonly known as:  LORTAB     oxyCODONE-acetaminophen 10-325 MG per tablet  Commonly known as:  PERCOCET     OXYCONTIN PO     traMADol 50 MG tablet  Commonly known as:  ULTRAM      TAKE these medications     Indication   hydrOXYzine 25 MG tablet  Commonly known as:  ATARAX/VISTARIL  Take 1 tablet (25 mg) three times daily for anxiety symptoms   Indication:  Anxiety Neurosis, Anxiety associated with Organic Disease, Tension     levonorgestrel 20 MCG/24HR IUD  Commonly known as:  MIRENA  1 Intra Uterine Device (1 each total) by Intrauterine route once. For birth control method   Indication:  Birth control     traZODone 50 MG tablet  Commonly known as:  DESYREL  Take 1 tablet (50 mg total) by mouth at bedtime and may repeat dose one time if needed. For sleep   Indication:  Trouble Sleeping       Follow-up Information   Follow up with Velta AddisonMichael Lefaive NP-Parrish McKinney On 09/18/2013. (Appt with Velta AddisonMichael Lefaive at 10:30AM for medication management.)    Contact information:   [address] [place], [place] [address] Phone: [phone] Fax: [phone]      Follow [facility]-Dr. Lilian KapurBanawan. (Dr. Doloris HallBanawan's office will call you with appointment date and time on discharge date. If you do not recieve call by this date, please call office at [phone].)    Contact information:   [address] [place], [place]  [address] Phone: [phone] Fax: x     Follow-up recommendations:  Activity:  As tolerated Diet: As recommended by your primary care doctor. Keep all scheduled follow-up appointments as recommended. Continue to work your relapse prevention plan Comments:  Take all your medications as prescribed by your mental healthcare provider. Report any adverse effects and or reactions from your medicines to your  outpatient provider promptly. Patient is instructed and cautioned to not engage in alcohol and or illegal drug use while on prescription medicines. In the event of worsening symptoms, patient is instructed to call the crisis hotline, 911 and or go to the nearest ED for appropriate evaluation and treatment of symptoms. Follow-up with your primary care provider for your other medical issues, concerns and or health care needs.   Total Discharge Time:  Greater than 30 minutes.  Signed: Sanjuana Kava, PMHNP, FNP 09/03/2013, 1:41 PM Agree with assessment and plan Reymundo Poll. Dub Mikes, M.D.

## 2013-08-31 NOTE — Progress Notes (Signed)
[facility] :  Will you be returning to the same living situation after discharge: Yes,  home with grandparents At discharge, do you have transportation home?:Yes,  grandmother Do you have the ability to pay for your medications:Yes,  [employer]  Release of information consent forms completed and submitted to Medical Records by CSW.  Patient to Follow up at: Follow-up Information   Follow up with Velta AddisonMichael Lefaive NP-Parrish McKinney On 09/18/2013. (Appt with Velta AddisonMichael Lefaive at 10:30AM for medication management.)    Contact information:   [address] [place], [place] [address] Phone: [phone] Fax: [phone]      [facility]Dr. Lilian KapurBanawan. (Dr. Doloris HallBanawan's office will call you with appointment date and time on discharge date. If you do not recieve call by this date, please call office at [phone].)    Contact information:   [address] [place], [place] [address] Phone: [phone] Fax: x      Patient denies SI/HI:   Yes,  during admission, group, and self report.     Safety Planning and Suicide Prevention discussed:  Yes,  SPE not required for this pt as she did not endorse SI during admission or during stay. SPI pamphlet provided to pt and she was encouraged to ask questions, talk about concerns, and share pamphlet with support network.   Smart, Ewing Fandino LCSWA  08/31/2013, 11:07 AM

## 2013-09-05 NOTE — Progress Notes (Addendum)
Patient Discharge Instructions:  After Visit Summary (AVS):   Faxed to:  09/05/13 Discharge Summary Note:   Faxed to:  09/05/13 Psychiatric Admission Assessment Note:   Faxed to:  09/05/13 Suicide Risk Assessment - Discharge Assessment:   Faxed to:  09/05/13 Faxed/Sent to the Next Level Care provider:  09/05/13 [facility] - Velta AddisonMichael Lefaive NP @ [phone] No documentation was sent to [facility] - Dr. Lilian KapurBanawan.  Unable to confirm fax number or address.  Multiple calls/Voicemails no answer.  Jerelene ReddenSheena E Vina, 09/05/2013, 3:45 PM

## 2014-05-21 ENCOUNTER — Emergency Department: Payer: Self-pay | Admitting: Emergency Medicine

## 2014-09-05 ENCOUNTER — Ambulatory Visit: Payer: Self-pay

## 2016-01-23 ENCOUNTER — Ambulatory Visit
Admission: EM | Admit: 2016-01-23 | Discharge: 2016-01-23 | Disposition: A | Discharge status: Home / Self Care | Payer: 59 | Attending: Family Medicine | Admitting: Family Medicine

## 2016-01-23 ENCOUNTER — Ambulatory Visit (INDEPENDENT_AMBULATORY_CARE_PROVIDER_SITE_OTHER): Payer: 59

## 2016-01-23 DIAGNOSIS — R200 Anesthesia of skin: Secondary | ICD-10-CM

## 2016-01-23 DIAGNOSIS — M6248 Contracture of muscle, other site: Secondary | ICD-10-CM

## 2016-01-23 DIAGNOSIS — M62838 Other muscle spasm: Secondary | ICD-10-CM

## 2016-01-23 DIAGNOSIS — R110 Nausea: Secondary | ICD-10-CM

## 2016-01-23 DIAGNOSIS — R202 Paresthesia of skin: Secondary | ICD-10-CM

## 2016-01-23 DIAGNOSIS — R208 Other disturbances of skin sensation: Secondary | ICD-10-CM | POA: Diagnosis not present

## 2016-01-23 LAB — PREGNANCY, URINE: PREG TEST UR: NEGATIVE

## 2016-01-23 MED ORDER — ONDANSETRON 8 MG PO TBDP: 8.0000 mg | ORAL_TABLET | Freq: Three times a day (TID) | ORAL | Status: DC | PRN

## 2016-01-23 MED ORDER — ONDANSETRON 8 MG PO TBDP
8.0000 mg | ORAL_TABLET | Freq: Once | ORAL | Status: AC
  Administered 2016-01-23: 8 mg via ORAL

## 2016-01-23 MED ORDER — MELOXICAM 15 MG PO TABS
15.0000 mg | ORAL_TABLET | Freq: Every day | ORAL | Status: DC
Start: 2016-01-23 — End: 2019-05-23

## 2016-01-23 MED ORDER — ORPHENADRINE CITRATE ER 100 MG PO TB12: 100.0000 mg | ORAL_TABLET | Freq: Two times a day (BID) | ORAL | Status: DC

## 2016-01-23 MED ORDER — ONDANSETRON HCL 4 MG/2ML IJ SOLN: 8.0000 mg | Freq: Once | INTRAMUSCULAR | Status: DC

## 2016-01-23 NOTE — ED Provider Notes (Signed)
CSN: [id number]     Arrival date & time 01/23/16  1929 History   First MD Initiated Contact with Patient 01/23/16 2125    Nurses notes were reviewed. Chief Complaint  Patient presents with  . Numbness   Is here because of numbness down her her left arm and left leg. She is apparently has some numbness down her left arm before but never for whole week and now her left leg is also having  numbness as well. She states that the numbness is been persistent and as far she is concerned. She also reports having some nausea and stomach discomfort as well. She has recently switched her birth control from Mirena to actual pills.  Past smoking history she has history of anorexia and depression and breast augmentation. She does smoke. She is a gravida 2 AB one miscarriage 1     (Consider location/radiation/quality/duration/timing/severity/associated sxs/prior Treatment) Patient is a 25 y.o. female presenting with extremity weakness. The history is provided by the patient. No language interpreter was used.  Extremity Weakness This is a new problem. The current episode started more than 1 week ago. The problem occurs constantly. The problem has been gradually worsening. Pertinent negatives include no chest pain, no abdominal pain, no headaches and no shortness of breath. Nothing aggravates the symptoms. Nothing relieves the symptoms. She has tried nothing for the symptoms. The treatment provided no relief.    Past Medical History  Diagnosis Date  . Anorexia   . Depression    Past Surgical History  Procedure Laterality Date  . Breast surgery     History reviewed. No pertinent family history. Social History  Substance Use Topics  . Smoking status: Current Every Day Smoker -- 0.50 packs/day for 5 years    Types: Cigarettes  . Smokeless tobacco: None  . Alcohol Use: 0.0 oz/week    0 Standard drinks or equivalent per week     Comment: occ   OB History    No data available     Review of Systems   Respiratory: Negative for shortness of breath.   Cardiovascular: Negative for chest pain.  Gastrointestinal: Negative for abdominal pain.  Musculoskeletal: Positive for extremity weakness. Negative for myalgias.  Neurological: Positive for weakness and numbness. Negative for headaches.  All other systems reviewed and are negative.   Allergies  Lactose intolerance (gi)  Home Medications   Prior to Admission medications   Medication Sig Start Date End Date Taking? Authorizing Provider  clonazePAM (KLONOPIN) 0.5 MG tablet Take 0.5 mg by mouth 2 (two) times daily as needed for anxiety.   Yes Historical Provider, MD  gabapentin (NEURONTIN) 100 MG capsule Take 100 mg by mouth 3 (three) times daily.   Yes Historical Provider, MD  levonorgestrel-ethinyl estradiol (SEASONALE,INTROVALE,JOLESSA) 0.15-0.03 MG tablet Take 1 tablet by mouth daily.   Yes Historical Provider, MD  lubiprostone (AMITIZA) 24 MCG capsule Take 24 mcg by mouth 2 (two) times daily with a meal.   Yes Historical Provider, MD  hydrOXYzine (ATARAX/VISTARIL) 25 MG tablet Take 1 tablet (25 mg) three times daily for anxiety symptoms 08/31/13   Sanjuana Kava, NP  levonorgestrel (MIRENA) 20 MCG/24HR IUD 1 Intra Uterine Device (1 each total) by Intrauterine route once. For birth control method 08/31/13   Sanjuana Kava, NP  meloxicam (MOBIC) 15 MG tablet Take 1 tablet (15 mg total) by mouth daily. 01/23/16   Hassan Rowan, MD  ondansetron (ZOFRAN ODT) 8 MG disintegrating tablet Take 1 tablet (8 mg total) by  mouth every 8 (eight) hours as needed for nausea or vomiting. 01/23/16   Hassan RowanEugene Coleby Yett, MD  orphenadrine (NORFLEX) 100 MG tablet Take 1 tablet (100 mg total) by mouth 2 (two) times daily. 01/23/16   Hassan RowanEugene Sharah Finnell, MD  traZODone (DESYREL) 50 MG tablet Take 1 tablet (50 mg total) by mouth at bedtime and may repeat dose one time if needed. For sleep 08/31/13   Sanjuana KavaAgnes I Nwoko, NP   Meds Ordered and Administered this Visit   Medications  ondansetron  (ZOFRAN-ODT) disintegrating tablet 8 mg (8 mg Oral Given 01/23/16 2203)    BP 113/65 mmHg  Pulse 69  Temp(Src) 98.8 F (37.1 C) (Tympanic)  Resp 16  Ht 5\' 3"  (1.6 m)  Wt 125 lb (56.7 kg)  BMI 22.15 kg/m2  SpO2 98% No data found.   Physical Exam  Constitutional: She is oriented to person, place, and time. She appears well-developed and well-nourished.  HENT:  Head: Normocephalic and atraumatic.  Right Ear: External ear normal.  Left Ear: External ear normal.  Mouth/Throat: Oropharynx is clear and moist.  Eyes: Conjunctivae are normal. Pupils are equal, round, and reactive to light. Right eye exhibits no discharge. No scleral icterus.  Neck: Normal range of motion. Neck supple. No tracheal deviation present. No thyromegaly present.  Cardiovascular: Normal rate, regular rhythm and normal heart sounds.   Pulmonary/Chest: Effort normal and breath sounds normal.  Abdominal: Soft. Bowel sounds are normal.  Musculoskeletal: Normal range of motion.  Neurological: She is alert and oriented to person, place, and time. She has normal strength. No cranial nerve deficit or sensory deficit.  Patient had tenderness over the C-spine and when the lower C-spine was palpated she reports having the numbness and tingling going down her left arm.  Skin: Skin is warm and dry.  Vitals reviewed.   ED Course  Procedures (including critical care time)  Labs Review Labs Reviewed  PREGNANCY, URINE    Imaging Review Dg Cervical Spine Complete  01/23/2016  CLINICAL DATA:  Left-sided neck pain for 1 week.  No known injury. EXAM: CERVICAL SPINE - COMPLETE 4+ VIEW COMPARISON:  None. FINDINGS: There is no evidence of cervical spine fracture or prevertebral soft tissue swelling. Alignment is normal. No other significant bone abnormalities are identified. Mild cervical kyphosis is noted, and may be due to patient positioning or muscle spasm. IMPRESSION: No evidence of cervical spine fracture or subluxation. Mild  cervical kyphosis may be due to patient positioning or muscle spasm; clinical correlation is recommended. Electronically Signed   By: Myles RosenthalJohn  Stahl M.D.   On: 01/23/2016 22:19     Visual Acuity Review  Right Eye Distance:   Left Eye Distance:   Bilateral Distance:    Right Eye Near:   Left Eye Near:    Bilateral Near:         MDM   1. Muscle spasms of neck   2. Numbness and tingling of right upper extremity   3. Numbness of left lower extremity   4. Nausea    We'll place patient on Norflex muscle relaxant for her neck and going to place him Mobic as well. Stressed that she's not better within about 3-4 days she needs to make appointment with her PCP for further evaluation of his numbness of her left upper arm and left leg.   For the nausea will place on Zofran 8 mg sublingual on a when necessary basis to see if it helps the nausea. We'll give a work note for  today and tomorrow and follow-up PCP if symptoms are not improving.    Hassan Rowan, MD 01/23/16 2256

## 2016-01-23 NOTE — Discharge Instructions (Signed)
Nausea and Vomiting Nausea means you feel sick to your stomach. Throwing up (vomiting) is a reflex where stomach contents come out of your mouth. HOME CARE   Take medicine as told by your doctor.  Do not force yourself to eat. However, you do need to drink fluids.  If you feel like eating, eat a normal diet as told by your doctor.  Eat rice, wheat, potatoes, bread, lean meats, yogurt, fruits, and vegetables.  Avoid high-fat foods.  Drink enough fluids to keep your pee (urine) clear or pale yellow.  Ask your doctor how to replace body fluid losses (rehydrate). Signs of body fluid loss (dehydration) include:  Feeling very thirsty.  Dry lips and mouth.  Feeling dizzy.  Dark pee.  Peeing less than normal.  Feeling confused.  Fast breathing or heart rate. GET HELP RIGHT AWAY IF:   You have blood in your throw up.  You have black or bloody poop (stool).  You have a bad headache or stiff neck.  You feel confused.  You have bad belly (abdominal) pain.  You have chest pain or trouble breathing.  You do not pee at least once every 8 hours.  You have cold, clammy skin.  You keep throwing up after 24 to 48 hours.  You have a fever. MAKE SURE YOU:   Understand these instructions.  Will watch your condition.  Will get help right away if you are not doing well or get worse.   This information is not intended to replace advice given to you by your health care provider. Make sure you discuss any questions you have with your health care provider.   Document Released: 01/26/2008 Document Revised: 11/01/2011 Document Reviewed: 01/08/2011 Elsevier Interactive Patient Education 2016 [employer]  Paresthesia Paresthesia is a burning or prickling feeling. This feeling can happen in any part of the body. It often happens in the hands, arms, legs, or feet. Usually, it is not painful. In most cases, the feeling goes away in a short time and is not a sign of a serious  problem. HOME CARE  Avoid drinking alcohol.  Try massage or needle therapy (acupuncture) to help with your problems.  Keep all follow-up visits as told by your doctor. This is important. GET HELP IF:  You keep on having episodes of paresthesia.  Your burning or prickling feeling gets worse when you walk.  You have pain or cramps.  You feel dizzy.  You have a rash. GET HELP RIGHT AWAY IF:  You feel weak.  You have trouble walking or moving.  You have problems speaking, understanding, or seeing.  You feel confused.  You cannot control when you pee (urinate) or poop (bowel movement).  You lose feeling (numbness) after an injury.  You pass out (faint).   This information is not intended to replace advice given to you by your health care provider. Make sure you discuss any questions you have with your health care provider.   Document Released: 07/22/2008 Document Revised: 12/24/2014 Document Reviewed: 08/05/2014 Elsevier Interactive Patient Education 2016 Else[employer] Inc. Acute Torticollis Torticollis is a condition in which the muscles of the neck tighten (contract) abnormally, causing the neck to twist and the head to move into an unnatural position. Torticollis that develops suddenly is called acute torticollis. If torticollis becomes chronic and is left untreated, the face and neck can become deformed. CAUSES This condition may be caused by: Sleeping in an awkward position (common). Extending or twisting the neck muscles beyond their normal  position. Infection. In some cases, the cause may not be known. SYMPTOMS Symptoms of this condition include: An unnatural position of the head. Neck pain. A limited ability to move the neck. Twisting of the neck to one side. DIAGNOSIS This condition is diagnosed with a physical exam. You may also have imaging tests, such as an X-ray, CT scan, or MRI. TREATMENT Treatment for this condition involves trying to relax the neck  muscles. It may include: Medicines or shots. Physical therapy. Surgery. This may be done in severe cases. HOME CARE INSTRUCTIONS Take medicines only as directed by your health care provider. Do stretching exercises and massage your neck as directed by your health care provider. Keep all follow-up visits as directed by your health care provider. This is important. SEEK MEDICAL CARE IF: You develop a fever. SEEK IMMEDIATE MEDICAL CARE IF: You develop difficulty breathing. You develop noisy breathing (stridor). You start drooling. You have trouble swallowing or have pain with swallowing. You develop numbness or weakness in your hands or feet. You have changes in your speech, understanding, or vision. Your pain gets worse.   This information is not intended to replace advice given to you by your health care provider. Make sure you discuss any questions you have with your health care provider.   Document Released: 08/06/2000 Document Revised: 12/24/2014 Document Reviewed: 08/05/2014 Elsevier Interactive Patient Education [employer]

## 2016-01-23 NOTE — ED Notes (Signed)
Patient complains of nausea, stomach burning, fatigue. Patient states that she had her Mirena taken out 2.5 weeks ago. Patient states that she has severe anxiety. Patient states that she started a new birth control medication 2 weeks ago. Patient states that she has been having dizziness only upon standing.

## 2016-03-09 ENCOUNTER — Encounter: Payer: Self-pay | Admitting: Emergency Medicine

## 2016-03-09 ENCOUNTER — Emergency Department
Admission: EM | Admit: 2016-03-09 | Discharge: 2016-03-09 | Disposition: A | Discharge status: Home / Self Care | Payer: 59 | Attending: Emergency Medicine | Admitting: Emergency Medicine

## 2016-03-09 DIAGNOSIS — Z5321 Procedure and treatment not carried out due to patient leaving prior to being seen by health care provider: Secondary | ICD-10-CM | POA: Diagnosis not present

## 2016-03-09 DIAGNOSIS — R350 Frequency of micturition: Secondary | ICD-10-CM | POA: Insufficient documentation

## 2016-03-09 LAB — URINALYSIS COMPLETE WITH MICROSCOPIC (ARMC ONLY)
BILIRUBIN URINE: NEGATIVE
GLUCOSE, UA: NEGATIVE mg/dL
Hgb urine dipstick: NEGATIVE
Ketones, ur: NEGATIVE mg/dL
Leukocytes, UA: NEGATIVE
Nitrite: POSITIVE — AB
Protein, ur: NEGATIVE mg/dL
Specific Gravity, Urine: 1.016 (ref 1.005–1.030)
pH: 5 (ref 5.0–8.0)

## 2016-03-09 LAB — POCT PREGNANCY, URINE: PREG TEST UR: NEGATIVE

## 2016-03-09 NOTE — ED Notes (Signed)
Pt arrived to the ED accompanied by her significant other for complaints of constipation, vomiting and frequent urination. Pt is AOx4 in no apparent distress and has concerns of being pregnant.

## 2016-03-11 ENCOUNTER — Telehealth: Payer: Self-pay | Admitting: Emergency Medicine

## 2016-03-11 NOTE — Telephone Encounter (Signed)
Called patient due to lwot to inquire about condition and follow up plans.  Number is not in service.  

## 2016-03-12 ENCOUNTER — Emergency Department: Payer: 59

## 2016-03-12 ENCOUNTER — Ambulatory Visit: Admission: EM | Admit: 2016-03-12 | Discharge: 2016-03-12 | Discharge status: Against Medical Advice | Payer: 59

## 2016-03-12 ENCOUNTER — Encounter: Payer: Self-pay | Admitting: Emergency Medicine

## 2016-03-12 ENCOUNTER — Emergency Department
Admission: EM | Admit: 2016-03-12 | Discharge: 2016-03-12 | Disposition: A | Discharge status: Home / Self Care | Payer: 59 | Attending: Emergency Medicine | Admitting: Emergency Medicine

## 2016-03-12 DIAGNOSIS — K5900 Constipation, unspecified: Secondary | ICD-10-CM | POA: Diagnosis present

## 2016-03-12 DIAGNOSIS — F17210 Nicotine dependence, cigarettes, uncomplicated: Secondary | ICD-10-CM | POA: Diagnosis not present

## 2016-03-12 DIAGNOSIS — Z79899 Other long term (current) drug therapy: Secondary | ICD-10-CM | POA: Insufficient documentation

## 2016-03-12 DIAGNOSIS — F329 Major depressive disorder, single episode, unspecified: Secondary | ICD-10-CM | POA: Insufficient documentation

## 2016-03-12 DIAGNOSIS — N3000 Acute cystitis without hematuria: Secondary | ICD-10-CM | POA: Diagnosis not present

## 2016-03-12 LAB — URINALYSIS COMPLETE WITH MICROSCOPIC (ARMC ONLY)
Bilirubin Urine: NEGATIVE
Glucose, UA: NEGATIVE mg/dL
Hgb urine dipstick: NEGATIVE
Leukocytes, UA: NEGATIVE
Nitrite: POSITIVE — AB
PH: 5 (ref 5.0–8.0)
PROTEIN: NEGATIVE mg/dL
SPECIFIC GRAVITY, URINE: 1.025 (ref 1.005–1.030)

## 2016-03-12 LAB — POCT PREGNANCY, URINE: PREG TEST UR: NEGATIVE

## 2016-03-12 MED ORDER — SULFAMETHOXAZOLE-TRIMETHOPRIM 800-160 MG PO TABS: 1.0000 | ORAL_TABLET | Freq: Two times a day (BID) | ORAL | Status: DC

## 2016-03-12 MED ORDER — MAGNESIUM CITRATE PO SOLN: 1.0000 | Freq: Once | ORAL | Status: DC

## 2016-03-12 NOTE — ED Notes (Signed)
Patient tolerated about half of the SSE and up to the bedside commode and was able to have a couple of small bowel movements. Finished other half and instructed patient to hold in rest of the enema until she could. Patient instructed to remain on left side and given call light to notify staff when she went again.

## 2016-03-12 NOTE — ED Notes (Signed)
Patient presents to the ED with constipation x 3 weeks.  Patient states she has taken a fleets enema as well as multiple laxatives and suppositories without results.  Patient is complaining of abdominal distention.  Patient denies abdominal pain and denies vomiting.  Patient states she has had this issue 5 years ago when she was very stressed.  Patient reports increased stress lately.

## 2016-03-12 NOTE — ED Provider Notes (Signed)
[facility] Emergency Department Provider Note  ____________________________________________  Time seen: Approximately 2:11 PM  I have reviewed the triage vital signs and the nursing notes.   HISTORY  Chief Complaint Constipation   HPI Elaine Warner is a 25 y.o. female who presents to the emergency department for evaluation of constipation. She states that she has been unable to have a bowel movement for the past 3 weeks. She has taken 3 laxative tablets, fleets enema, glycerin suppository, and has tripled her Amitiza. She states that this happened about 5 years ago and she got really stressed out and she has been very stressed last month. She denies vomiting. She complains of bloating and diffuse, mild abdominal discomfort. She denies fever.She denies opioid use and "heroin."   Past Medical History  Diagnosis Date  . Anorexia   . Depression     Patient Active Problem List   Diagnosis Date Noted  . Polysubstance dependence including opioid type drug, continuous use (HCC) 08/29/2013  . Substance induced mood disorder (HCC) 08/28/2013    Past Surgical History  Procedure Laterality Date  . Breast surgery      Current Outpatient Rx  Name  Route  Sig  Dispense  Refill  . clonazePAM (KLONOPIN) 0.5 MG tablet   Oral   Take 0.5 mg by mouth 2 (two) times daily as needed for anxiety.         . gabapentin (NEURONTIN) 100 MG capsule   Oral   Take 100 mg by mouth 3 (three) times daily.         . hydrOXYzine (ATARAX/VISTARIL) 25 MG tablet      Take 1 tablet (25 mg) three times daily for anxiety symptoms   90 tablet   0   . levonorgestrel (MIRENA) 20 MCG/24HR IUD   Intrauterine   1 Intra Uterine Device (1 each total) by Intrauterine route once. For birth control method   1 each   0   . levonorgestrel-ethinyl estradiol (SEASONALE,INTROVALE,JOLESSA) 0.15-0.03 MG tablet   Oral   Take 1 tablet by mouth daily.         Marland Kitchen lubiprostone  (AMITIZA) 24 MCG capsule   Oral   Take 24 mcg by mouth 2 (two) times daily with a meal.         . magnesium citrate SOLN   Oral   Take 296 mLs (1 Bottle total) by mouth once.   195 mL   0   . meloxicam (MOBIC) 15 MG tablet   Oral   Take 1 tablet (15 mg total) by mouth daily.   30 tablet   1   . ondansetron (ZOFRAN ODT) 8 MG disintegrating tablet   Oral   Take 1 tablet (8 mg total) by mouth every 8 (eight) hours as needed for nausea or vomiting.   20 tablet   0   . orphenadrine (NORFLEX) 100 MG tablet   Oral   Take 1 tablet (100 mg total) by mouth 2 (two) times daily.   60 tablet   0   . sulfamethoxazole-trimethoprim (BACTRIM DS,SEPTRA DS) 800-160 MG tablet   Oral   Take 1 tablet by mouth 2 (two) times daily.   6 tablet   0   . traZODone (DESYREL) 50 MG tablet   Oral   Take 1 tablet (50 mg total) by mouth at bedtime and may repeat dose one time if needed. For sleep   30 tablet   0     Allergies Lactose  intolerance (gi)  No family history on file.  Social History Social History  Substance Use Topics  . Smoking status: Current Every Day Smoker -- 0.50 packs/day for 5 years    Types: Cigarettes  . Smokeless tobacco: None  . Alcohol Use: 0.0 oz/week    0 Standard drinks or equivalent per week     Comment: occ    Review of Systems Constitutional: No fever/chills Respiratory: Denies shortness of breath. Gastrointestinal: Mild abdominal pain.  No nausea, no vomiting. Positive for constipation. Genitourinary: Negative for dysuria. Musculoskeletal: Negative for back pain. Skin: Negative for rash.  ____________________________________________   PHYSICAL EXAM:  VITAL SIGNS: ED Triage Vitals  Enc Vitals Group     BP 03/12/16 1347 124/68 mmHg     Pulse Rate 03/12/16 1347 84     Resp 03/12/16 1347 18     Temp 03/12/16 1347 98 F (36.7 C)     Temp Source 03/12/16 1347 Oral     SpO2 03/12/16 1347 100 %     Weight 03/12/16 1347 125 lb (56.7 kg)      Height 03/12/16 1347 5\' 3"  (1.6 m)     Head Cir --      Peak Flow --      Pain Score --      Pain Loc --      Pain Edu? --      Excl. in GC? --     Constitutional: Alert and oriented. Well appearing and in no acute distress. Eyes: Conjunctivae are normal. PERRL. EOMI. Head: Atraumatic. Nose: No congestion/rhinnorhea. Mouth/Throat: Mucous membranes are moist. Neck: No stridor.   Cardiovascular: Normal rate, regular rhythm. Grossly normal heart sounds.  Good peripheral circulation. Respiratory: Normal respiratory effort.  No retractions. Lungs CTAB. Gastrointestinal: Soft and nontender. Mildly distended. No abdominal bruits. Musculoskeletal: No lower extremity tenderness nor edema.  No joint effusions. Neurologic:  Normal speech and language. No gross focal neurologic deficits are appreciated. No gait instability. Skin:  Skin is warm, dry and intact. No rash noted. Psychiatric: Mood and affect are normal. Speech and behavior are normal.  ____________________________________________   LABS (all labs ordered are listed, but only abnormal results are displayed)  Labs Reviewed  URINALYSIS COMPLETEWITH MICROSCOPIC ([facility] ONLY) - Abnormal; Notable for the following:    Color, Urine YELLOW (*)    APPearance HAZY (*)    Ketones, ur TRACE (*)    Nitrite POSITIVE (*)    Bacteria, UA MANY (*)    Squamous Epithelial / LPF 0-5 (*)    All other components within normal limits  POCT PREGNANCY, URINE   ____________________________________________  EKG   ____________________________________________  RADIOLOGY   ____________________________________________   PROCEDURES  Procedure(s) performed: None  Procedures  Critical Care performed: No  ____________________________________________   INITIAL IMPRESSION / ASSESSMENT AND PLAN / ED COURSE  Pertinent labs & imaging results that were available during my care of the patient were reviewed by me and considered in my medical  decision making (see chart for details).  The patient was instructed to also purchase a bottle of magnesium citrate at the pharmacy and drink that if she has not had a large bowel movement within 6 hours. She will be given a referral to see gastroenterology. Diet choices and increased in water intake was discussed. She will also be treated for her urinary tract infection. She was instructed to follow-up with the primary care provider for symptoms that are not improving over the next few days. She was instructed  to return to the emergency department for symptoms that change or worsen if she is unable schedule an appointment. ____________________________________________   FINAL CLINICAL IMPRESSION(S) / ED DIAGNOSES  Final diagnoses:  Constipation, unspecified constipation type  Acute cystitis without hematuria      NEW MEDICATIONS STARTED DURING THIS VISIT:  Discharge Medication List as of 03/12/2016  3:31 PM    START taking these medications   Details  magnesium citrate SOLN Take 296 mLs (1 Bottle total) by mouth once., Starting 03/12/2016, Print    sulfamethoxazole-trimethoprim (BACTRIM DS,SEPTRA DS) 800-160 MG tablet Take 1 tablet by mouth 2 (two) times daily., Starting 03/12/2016, Until Discontinued, Print         Note:  This document was prepared using Dragon voice recognition software and may include unintentional dictation errors.    Chinita Pester, FNP 03/13/16 1559  Sharyn Creamer, MD 03/13/16 [phone]

## 2016-03-12 NOTE — ED Notes (Signed)
No BM x 3 weeks. Denies any abdominal pain, but does report tenderness to abdomen upon palpation.

## 2016-03-12 NOTE — ED Notes (Signed)
Returned from XR 

## 2018-04-11 ENCOUNTER — Encounter: Payer: Self-pay | Admitting: Emergency Medicine

## 2018-04-11 ENCOUNTER — Emergency Department
Admission: EM | Admit: 2018-04-11 | Discharge: 2018-04-11 | Disposition: A | Discharge status: Home / Self Care | Payer: 59 | Attending: Emergency Medicine | Admitting: Emergency Medicine

## 2018-04-11 ENCOUNTER — Other Ambulatory Visit: Payer: Self-pay

## 2018-04-11 DIAGNOSIS — R590 Localized enlarged lymph nodes: Secondary | ICD-10-CM | POA: Insufficient documentation

## 2018-04-11 DIAGNOSIS — Z79899 Other long term (current) drug therapy: Secondary | ICD-10-CM | POA: Insufficient documentation

## 2018-04-11 DIAGNOSIS — F17210 Nicotine dependence, cigarettes, uncomplicated: Secondary | ICD-10-CM | POA: Insufficient documentation

## 2018-04-11 MED ORDER — CEPHALEXIN 500 MG PO CAPS: 500.0000 mg | ORAL_CAPSULE | Freq: Three times a day (TID) | ORAL | 0 refills | Status: DC

## 2018-04-11 MED ORDER — NAPROXEN 500 MG PO TABS: 500.0000 mg | ORAL_TABLET | Freq: Two times a day (BID) | ORAL | 0 refills | Status: AC

## 2018-04-11 MED ORDER — TRAMADOL HCL 50 MG PO TABS: 50.0000 mg | ORAL_TABLET | Freq: Two times a day (BID) | ORAL | 0 refills | Status: DC

## 2018-04-11 NOTE — ED Triage Notes (Signed)
Pt in via POV with complaints of inguinal hernia to left groin, worsening pain x last four days.  Pt reports hx of same.  Vitals WDL, NAD noted at this time.

## 2018-04-11 NOTE — ED Notes (Signed)
Pt states inguinal hernia noted to inner thigh

## 2018-04-11 NOTE — Discharge Instructions (Addendum)
Your exam is overall normal, showing only shoddy lymph nodes. You should take the prescription antibiotic as directed, and the pain medicine as needed. Follow-up with your provider for ongoing symptoms.

## 2018-04-11 NOTE — ED Notes (Signed)
Pt ambulatory to wheel chair upon discharge. Pt and family member verbalized understanding of discharge instructions, follow-up care and prescriptions. VSS. Skin warm and dry. A&O x4.

## 2018-04-12 NOTE — ED Provider Notes (Signed)
[facility] Emergency Department Provider Note ____________________________________________  Time seen: 1649  I have reviewed the triage vital signs and the nursing notes.  HISTORY  Chief Complaint  Inguinal Hernia  HPI Elaine Warner is a 27 y.o. female presents to the ED accompanied by family member, for evaluation of what she assumed this is an inguinal hernia on the left side.  Patient reports tenderness to the inguinal groin region.  Without recent injury, accident, trauma.  She gives a history of a similar episode about 2 years prior that was evaluated and treated at [facility].  She was treated at that time for a lymph nodal mass.  She was evaluated and discharged with anti-inflammatories and pain medicine.  Patient denies any recent fevers, chills, or sweats. The patient also denies any skin changes, lesions, or vaginal discharge.  She reports tenderness to palpation in the region and notes that pain is impairing her ability to walk.  She denies any dysuria, hematuria, diarrhea, or abdominal pain.  She also denies any sores or lesions to the lower extremities.  Past Medical History:  Diagnosis Date  . Anorexia   . Depression     Patient Active Problem List   Diagnosis Date Noted  . Polysubstance dependence including opioid type drug, continuous use (HCC) 08/29/2013  . Substance induced mood disorder (HCC) 08/28/2013    Past Surgical History:  Procedure Laterality Date  . BREAST SURGERY      Prior to Admission medications   Medication Sig Start Date End Date Taking? Authorizing Provider  etonogestrel-ethinyl estradiol (NUVARING) 0.12-0.015 MG/24HR vaginal ring Place 1 each vaginally every 28 (twenty-eight) days. Insert vaginally and leave in place for 3 consecutive weeks, then remove for 1 week.   Yes [provider]  cephALEXin (KEFLEX) 500 MG capsule Take 1 capsule (500 mg total) by mouth 3 (three) times daily. 04/11/18    Marylynne Keelin, Charlesetta Ivory, PA-C  clonazePAM (KLONOPIN) 0.5 MG tablet Take 0.5 mg by mouth 2 (two) times daily as needed for anxiety.    [provider]  gabapentin (NEURONTIN) 100 MG capsule Take 100 mg by mouth 3 (three) times daily.    [provider]  hydrOXYzine (ATARAX/VISTARIL) 25 MG tablet Take 1 tablet (25 mg) three times daily for anxiety symptoms 08/31/13   Armandina Stammer I, NP  levonorgestrel (MIRENA) 20 MCG/24HR IUD 1 Intra Uterine Device (1 each total) by Intrauterine route once. For birth control method 08/31/13   Armandina Stammer I, NP  levonorgestrel-ethinyl estradiol (SEASONALE,INTROVALE,JOLESSA) 0.15-0.03 MG tablet Take 1 tablet by mouth daily.    [provider]  lubiprostone (AMITIZA) 24 MCG capsule Take 24 mcg by mouth 2 (two) times daily with a meal.    [provider]  magnesium citrate SOLN Take 296 mLs (1 Bottle total) by mouth once. 03/12/16   Triplett, Rulon Eisenmenger B, FNP  meloxicam (MOBIC) 15 MG tablet Take 1 tablet (15 mg total) by mouth daily. 01/23/16   Hassan Rowan, MD  naproxen (NAPROSYN) 500 MG tablet Take 1 tablet (500 mg total) by mouth 2 (two) times daily with a meal for 15 days. 04/11/18 04/26/18  Jerone Cudmore, Charlesetta Ivory, PA-C  ondansetron (ZOFRAN ODT) 8 MG disintegrating tablet Take 1 tablet (8 mg total) by mouth every 8 (eight) hours as needed for nausea or vomiting. 01/23/16   Hassan Rowan, MD  orphenadrine (NORFLEX) 100 MG tablet Take 1 tablet (100 mg total) by mouth 2 (two) times daily. 01/23/16  Hassan RowanWade, Eugene, MD  sulfamethoxazole-trimethoprim (BACTRIM DS,SEPTRA DS) 800-160 MG tablet Take 1 tablet by mouth 2 (two) times daily. 03/12/16   Triplett, Rulon Eisenmengerari B, FNP  traMADol (ULTRAM) 50 MG tablet Take 1 tablet (50 mg total) by mouth 2 (two) times daily. 04/11/18   Sameera Betton, Charlesetta IvoryJenise V Bacon, PA-C  traZODone (DESYREL) 50 MG tablet Take 1 tablet (50 mg total) by mouth at bedtime and may repeat dose one time if needed. For sleep 08/31/13   Armandina StammerNwoko, Agnes I, NP     Allergies Lactose intolerance (gi)  History reviewed. No pertinent family history.  Social History Social History   Tobacco Use  . Smoking status: Current Every Day Smoker    Packs/day: 0.50    Years: 5.00    Pack years: 2.50    Types: Cigarettes  . Smokeless tobacco: Never Used  Substance Use Topics  . Alcohol use: Yes    Alcohol/week: 0.0 standard drinks    Comment: occ  . Drug use: Yes    Comment: opiates/benzos    Review of Systems  Constitutional: Negative for fever. Eyes: Negative for visual changes. ENT: Negative for sore throat. Cardiovascular: Negative for chest pain. Respiratory: Negative for shortness of breath. Gastrointestinal: Negative for abdominal pain, vomiting and diarrhea. Genitourinary: Negative for dysuria. Musculoskeletal: Negative for back pain.  Left groin pain as above. Skin: Negative for rash. Neurological: Negative for headaches, focal weakness or numbness. ____________________________________________  PHYSICAL EXAM:  VITAL SIGNS: ED Triage Vitals  Enc Vitals Group     BP 04/11/18 1557 121/68     Pulse Rate 04/11/18 1557 75     Resp 04/11/18 1557 18     Temp 04/11/18 1557 98.2 F (36.8 C)     Temp Source 04/11/18 1557 Oral     SpO2 04/11/18 1557 100 %     Weight 04/11/18 1557 127 lb (57.6 kg)     Height 04/11/18 1557 5\' 3"  (1.6 m)     Head Circumference --      Peak Flow --      Pain Score 04/11/18 1603 7     Pain Loc --      Pain Edu? --      Excl. in GC? --     Constitutional: Alert and oriented. Well appearing and in no distress. Head: Normocephalic and atraumatic. Eyes: Conjunctivae are normal. Normal extraocular movements Ears: Canals clear. TMs intact bilaterally. Hematological/Lymphatic/Immunological: No inguinal lymphadenopathy. Patient with small, shoddy, lymph nodes bilaterally to the inguinal region. No adenitis, or overlying skin changes appreciated.  Cardiovascular: Normal rate, regular rhythm. Normal distal  pulses. Respiratory: Normal respiratory effort. No wheezes/rales/rhonchi. Gastrointestinal: Soft and nontender. No distention. Musculoskeletal: Nontender with normal range of motion in all extremities.  Neurologic:  Normal gait without ataxia. Normal speech and language. No gross focal neurologic deficits are appreciated. Skin:  Skin is warm, dry and intact. No rash noted. Psychiatric: Mood and affect are normal. Patient exhibits appropriate insight and judgment. ____________________________________________  PROCEDURES  Procedures ____________________________________________  INITIAL IMPRESSION / ASSESSMENT AND PLAN / ED COURSE  Patient with ED evaluation of left inguinal pain with a clinical exam that does not reveal any significant lymphadenopathy.  Patient is exquisitely tender to palpation of the region without any warmth, erythema, or induration.  No abnormal lymph nodes appreciated.  Review of the chart shows a similar episode 2 years prior.  Patient had a significant work-up including CT imaging and was ultimately found to have a 2 cm lymph node in the  right inguinal area.  Given that the patient has no indication of any acute infectious or inflammatory process.  It seems unlikely that she is awake with a unilateral lymphadenopathy at this time.  She also does not have any inguinal hernias.  Patient is declined any pelvic exam or further work-up at this time, and I would agree that the likelihood of any significant infectious process is limited.  Patient is advised to follow-up with her primary provider in 1 week should symptoms worsen.  She is discharged with a prescription for Keflex, proximal and, and #6 Ultram.  Return precautions have been reviewed.  I reviewed the patient's prescription history over the last 12 months in the multi-state controlled substances database(s) that includes [place], [place], [place], [place], [place], [place], [place], [place], [place], [place], [place], [place], and [place].  Results were notable for no narcotic prescriptions.  ____________________________________________  FINAL CLINICAL IMPRESSION(S) / ED DIAGNOSES  Final diagnoses:  Lymphadenopathy, inguinal      Karmen StabsMenshew, Charlesetta IvoryJenise V Bacon, PA-C 04/12/18 0057    Jeanmarie PlantMcShane, James A, MD 04/15/18 [phone]

## 2018-04-19 IMAGING — CR DG CERVICAL SPINE COMPLETE 4+V
5 series · 5 of 5 positions shown · non-contrast
Comparison: None.

CLINICAL DATA: Left-sided neck pain for 1 week.  No known injury.

EXAM:
CERVICAL SPINE - COMPLETE 4+ VIEW

[c-spine lat]
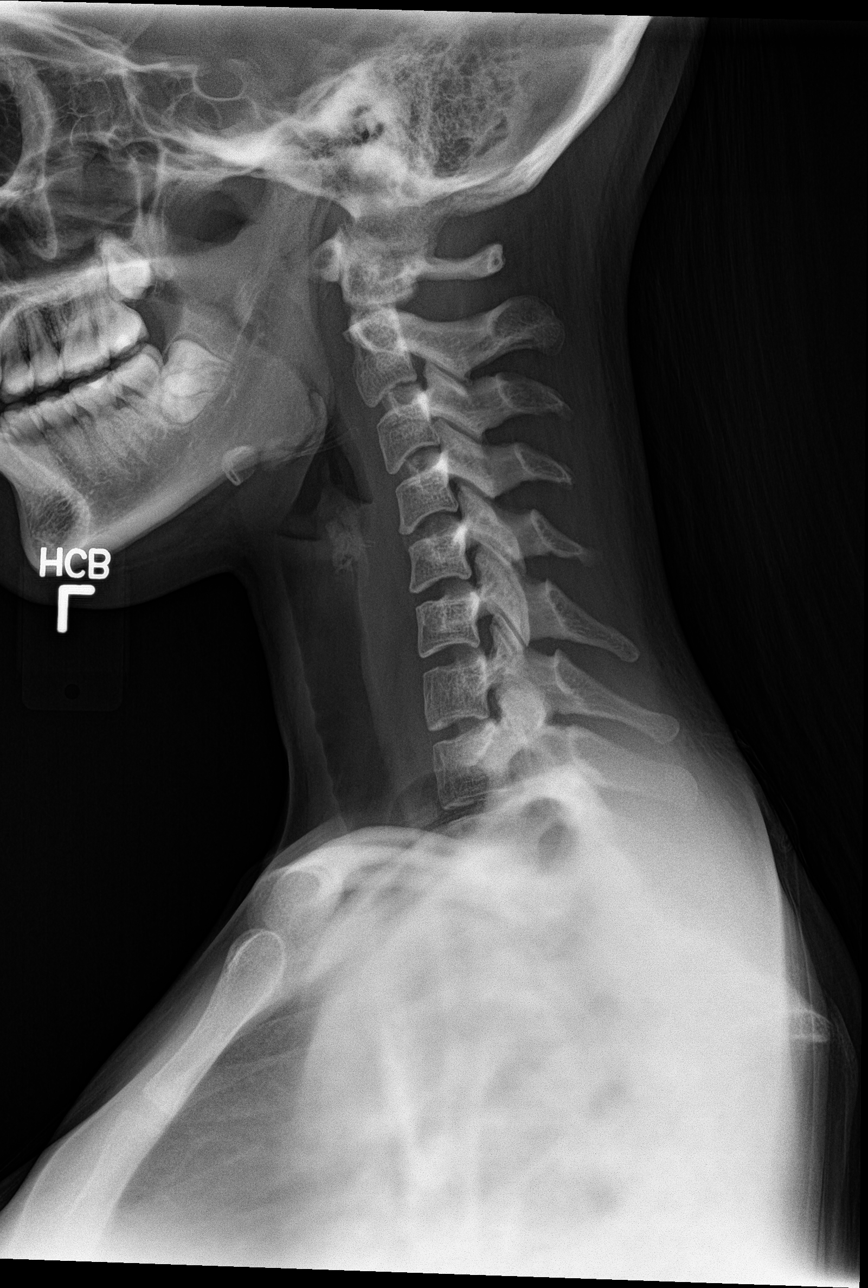

[c-spine obl (1 of 2)]
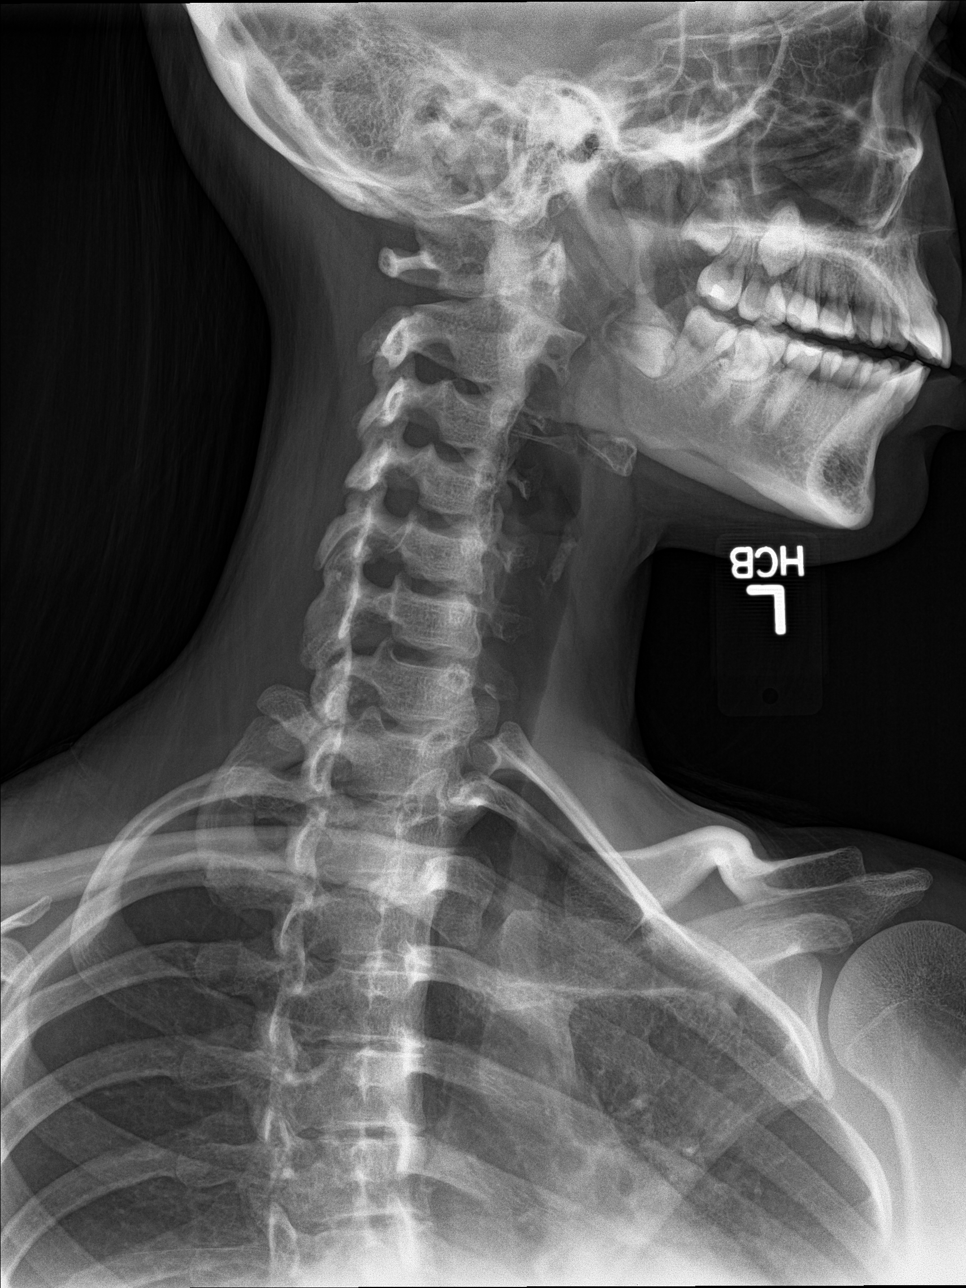

[c-spine obl (2 of 2)]
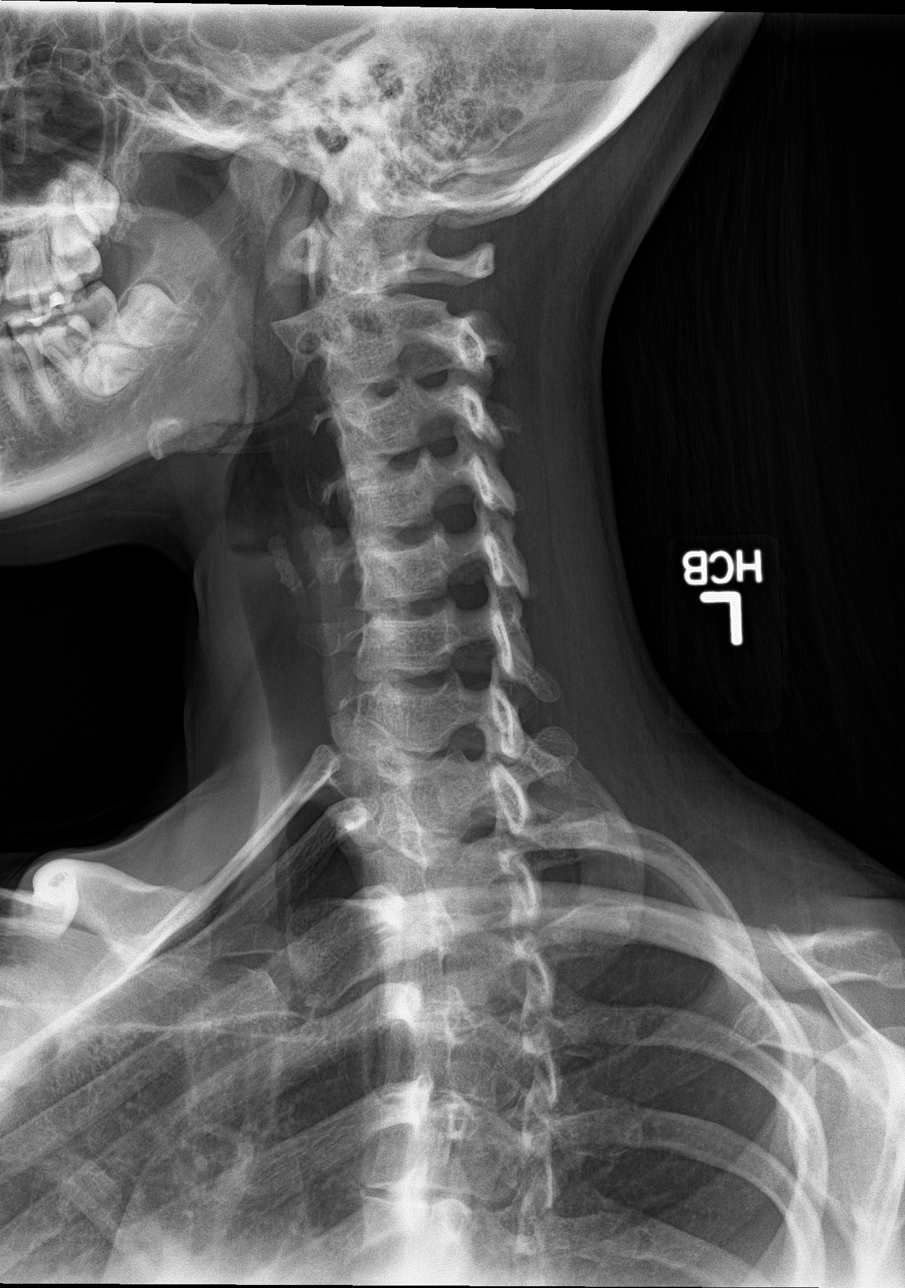

[c-spine ap]
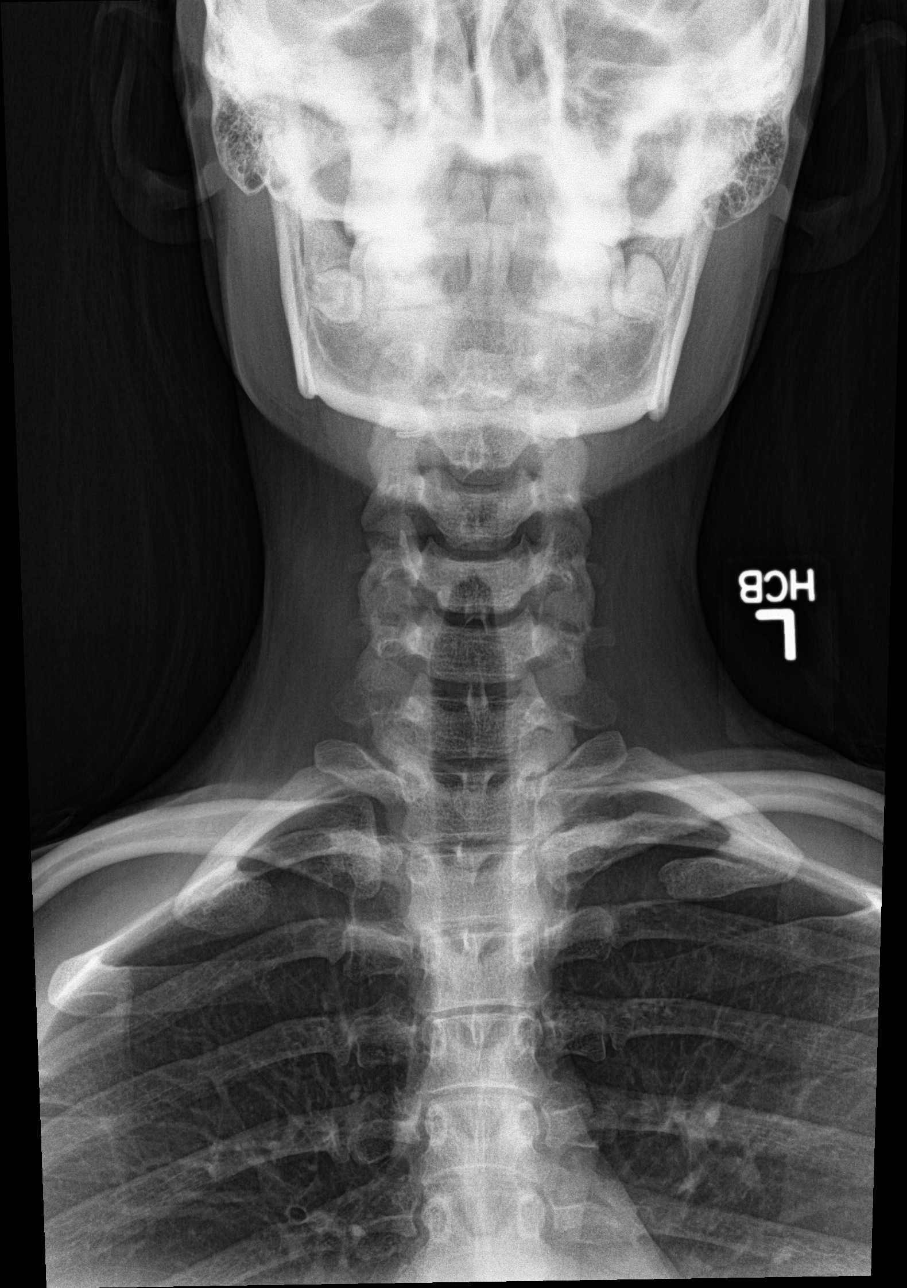

[c-spine open mouth]
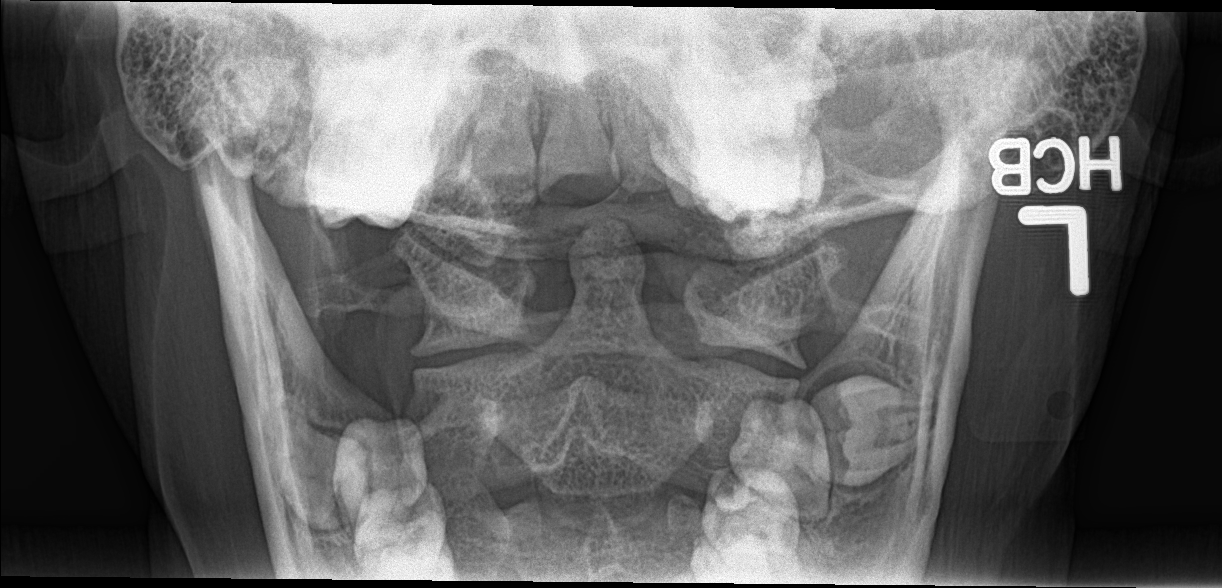

[5 of 5 positions shown; findings below may reference images not displayed]

FINDINGS: There is no evidence of cervical spine fracture or prevertebral soft
tissue swelling. Alignment is normal. No other significant bone
abnormalities are identified. Mild cervical kyphosis is noted, and
may be due to patient positioning or muscle spasm.
IMPRESSION: No evidence of cervical spine fracture or subluxation.

Mild cervical kyphosis may be due to patient positioning or muscle
spasm; clinical correlation is recommended.

## 2018-05-24 ENCOUNTER — Encounter: Payer: Self-pay | Admitting: Adult Health

## 2018-05-24 ENCOUNTER — Ambulatory Visit: Payer: 59 | Admitting: Adult Health

## 2018-05-24 VITALS — BP 131/75 | HR 85 | Resp 16 | Ht 63.0 in | Wt 124.3 lb

## 2018-05-24 DIAGNOSIS — K5909 Other constipation: Secondary | ICD-10-CM | POA: Diagnosis not present

## 2018-05-24 DIAGNOSIS — F17200 Nicotine dependence, unspecified, uncomplicated: Secondary | ICD-10-CM | POA: Diagnosis not present

## 2018-05-24 DIAGNOSIS — F411 Generalized anxiety disorder: Secondary | ICD-10-CM

## 2018-05-24 DIAGNOSIS — R5383 Other fatigue: Secondary | ICD-10-CM

## 2018-05-24 DIAGNOSIS — R42 Dizziness and giddiness: Secondary | ICD-10-CM | POA: Diagnosis not present

## 2018-05-24 DIAGNOSIS — F5000 Anorexia nervosa, unspecified: Secondary | ICD-10-CM

## 2018-05-24 MED ORDER — MECLIZINE HCL 25 MG PO TABS: 25.0000 mg | ORAL_TABLET | Freq: Every day | ORAL | 0 refills | Status: DC

## 2018-05-24 MED ORDER — CLONAZEPAM 0.5 MG PO TABS: 0.5000 mg | ORAL_TABLET | Freq: Two times a day (BID) | ORAL | 1 refills | Status: DC | PRN

## 2018-05-24 MED ORDER — HYDROXYZINE HCL 25 MG PO TABS: ORAL_TABLET | ORAL | 0 refills | Status: DC

## 2018-05-24 NOTE — Progress Notes (Addendum)
[facility] [address] [place], [place] [address]  Internal MEDICINE  Office Visit Note  Patient Name: Elaine Warner  [id number]  [id number]  Date of Service: 05/29/2018   Complaints/HPI Pt is here for establishment of PCP. Chief Complaint  Patient presents with  . New Patient (Initial Visit)  . Dizziness    severe dizziness pt thinks its vertigo, pt stands up slowly and gets really dizzy  . Anxiety    pt states that she thinks her anxiety   HPI Pt here to re-establish care.  She is a well appearing 27 yo woman. 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 She has a history of Anorexia since she was 17, chronic constipation and anxiety.  She reports for the last year she has been having vertigo-like spells of dizziness, she denies syncope.  She reports episodes last for a few seconds sometimes, or as much as a minute.  She reports it is common when she stands up quickly. She reports moving back to the area about a month ago due to her long time relationship having trouble.  She reports increased anxiety since moving back. She was taking Ativan and clonazepam to manage this.  Current Medication: Outpatient Encounter Medications as of 05/24/2018  Medication Sig  . clonazePAM (KLONOPIN) 0.5 MG tablet Take 1 tablet (0.5 mg total) by mouth 2 (two) times daily as needed for anxiety.  Marland Kitchen etonogestrel-ethinyl estradiol (NUVARING) 0.12-0.015 MG/24HR vaginal ring Place 1 each vaginally every 28 (twenty-eight) days. Insert vaginally and leave in place for 3 consecutive weeks, then remove for 1 week.  . folic acid (FOLVITE) 0.5 MG tablet Take 0.5 mg by mouth daily.  Marland Kitchen LORazepam (ATIVAN) 0.5 MG tablet Take 0.5 mg by mouth every 8 (eight) hours.  . meloxicam (MOBIC) 15 MG tablet Take 1 tablet (15 mg total) by mouth daily.  . [DISCONTINUED] clonazePAM (KLONOPIN) 0.5 MG tablet Take 0.5 mg by mouth 2 (two) times daily as needed for anxiety.  .  cephALEXin (KEFLEX) 500 MG capsule Take 1 capsule (500 mg total) by mouth 3 (three) times daily. (Patient not taking: Reported on 05/24/2018)  . gabapentin (NEURONTIN) 100 MG capsule Take 100 mg by mouth 3 (three) times daily.  . hydrOXYzine (ATARAX/VISTARIL) 25 MG tablet Take 1 tablet (25 mg) three times daily for anxiety symptoms  . meclizine (ANTIVERT) 25 MG tablet Take 1 tablet (25 mg total) by mouth daily.  . orphenadrine (NORFLEX) 100 MG tablet Take 1 tablet (100 mg total) by mouth 2 (two) times daily. (Patient not taking: Reported on 05/24/2018)  . sulfamethoxazole-trimethoprim (BACTRIM DS,SEPTRA DS) 800-160 MG tablet Take 1 tablet by mouth 2 (two) times daily. (Patient not taking: Reported on 05/24/2018)  . [DISCONTINUED] hydrOXYzine (ATARAX/VISTARIL) 25 MG tablet Take 1 tablet (25 mg) three times daily for anxiety symptoms (Patient not taking: Reported on 05/24/2018)  . [DISCONTINUED] levonorgestrel (MIRENA) 20 MCG/24HR IUD 1 Intra Uterine Device (1 each total) by Intrauterine route once. For birth control method (Patient not taking: Reported on 05/24/2018)  . [DISCONTINUED] levonorgestrel-ethinyl estradiol (SEASONALE,INTROVALE,JOLESSA) 0.15-0.03 MG tablet Take 1 tablet by mouth daily.  . [DISCONTINUED] lubiprostone (AMITIZA) 24 MCG capsule Take 24 mcg by mouth 2 (two) times daily with a meal.  . [DISCONTINUED] magnesium citrate SOLN Take 296 mLs (1 Bottle total) by mouth once. (Patient not taking: Reported on 05/24/2018)  . [DISCONTINUED] ondansetron (ZOFRAN ODT) 8 MG disintegrating tablet Take 1 tablet (8 mg total) by mouth every 8 (  eight) hours as needed for nausea or vomiting. (Patient not taking: Reported on 05/24/2018)  . [DISCONTINUED] traMADol (ULTRAM) 50 MG tablet Take 1 tablet (50 mg total) by mouth 2 (two) times daily. (Patient not taking: Reported on 05/24/2018)  . [DISCONTINUED] traZODone (DESYREL) 50 MG tablet Take 1 tablet (50 mg total) by mouth at bedtime and may repeat dose one time if  needed. For sleep (Patient not taking: Reported on 05/24/2018)   No facility-administered encounter medications on file as of 05/24/2018.     Surgical History: Past Surgical History:  Procedure Laterality Date  . BREAST SURGERY      Medical History: Past Medical History:  Diagnosis Date  . Anorexia   . Depression     Family History: History reviewed. No pertinent family history.  Social History   Socioeconomic History  . Marital status: Single    Spouse name: Not on file  . Number of children: Not on file  . Years of education: Not on file  . Highest education level: Not on file  Occupational History  . Not on file  Social Needs  . Financial resource strain: Not on file  . Food insecurity:    Worry: Not on file    Inability: Not on file  . Transportation needs:    Medical: Not on file    Non-medical: Not on file  Tobacco Use  . Smoking status: Current Every Day Smoker    Packs/day: 0.50    Years: 5.00    Pack years: 2.50    Types: Cigarettes  . Smokeless tobacco: Never Used  Substance and Sexual Activity  . Alcohol use: Yes    Alcohol/week: 0.0 standard drinks    Comment: occasionally  . Drug use: Yes    Comment: opiates/benzos  . Sexual activity: Yes    Birth control/protection: None  Lifestyle  . Physical activity:    Days per week: Not on file    Minutes per session: Not on file  . Stress: Not on file  Relationships  . Social connections:    Talks on phone: Not on file    Gets together: Not on file    Attends religious service: Not on file    Active member of club or organization: Not on file    Attends meetings of clubs or organizations: Not on file    Relationship status: Not on file  . Intimate partner violence:    Fear of current or ex partner: Not on file    Emotionally abused: Not on file    Physically abused: Not on file    Forced sexual activity: Not on file  Other Topics Concern  . Not on file  Social History Narrative  . Not on  file     Review of Systems  Constitutional: Negative for chills, fatigue and unexpected weight change.  HENT: Negative for congestion, rhinorrhea, sneezing and sore throat.   Eyes: Negative for photophobia, pain and redness.  Respiratory: Negative for cough, chest tightness and shortness of breath.   Cardiovascular: Negative for chest pain and palpitations.  Gastrointestinal: Negative for abdominal pain, constipation, diarrhea, nausea and vomiting.  Endocrine: Negative.   Genitourinary: Negative for dysuria and frequency.  Musculoskeletal: Negative for arthralgias, back pain, joint swelling and neck pain.  Skin: Negative for rash.  Allergic/Immunologic: Negative.   Neurological: Negative for tremors and numbness.  Hematological: Negative for adenopathy. Does not bruise/bleed easily.  Psychiatric/Behavioral: Negative for behavioral problems and sleep disturbance. The patient is not nervous/anxious.  Vital Signs: BP 131/75 (BP Location: Right Arm, Patient Position: Sitting, Cuff Size: Normal)   Pulse 85   Resp 16   Ht 5\' 3"  (1.6 m)   Wt 124 lb 4.8 oz (56.4 kg)   SpO2 100%   BMI 22.02 kg/m    Physical Exam  Constitutional: She is oriented to person, place, and time. She appears well-developed and well-nourished. No distress.  HENT:  Head: Normocephalic and atraumatic.  Mouth/Throat: Oropharynx is clear and moist. No oropharyngeal exudate.  Eyes: Pupils are equal, round, and reactive to light. EOM are normal.  Neck: Normal range of motion. Neck supple. No JVD present. No tracheal deviation present. No thyromegaly present.  Cardiovascular: Normal rate, regular rhythm and normal heart sounds. Exam reveals no gallop and no friction rub.  No murmur heard. Pulmonary/Chest: Effort normal and breath sounds normal. No respiratory distress. She has no wheezes. She has no rales. She exhibits no tenderness.  Abdominal: Soft. There is no tenderness. There is no guarding.   Musculoskeletal: Normal range of motion.  Lymphadenopathy:    She has no cervical adenopathy.  Neurological: She is alert and oriented to person, place, and time. No cranial nerve deficit.  Skin: Skin is warm and dry. She is not diaphoretic.  Psychiatric: She has a normal mood and affect. Her behavior is normal. Judgment and thought content normal.  Nursing note and vitals reviewed.   Assessment/Plan: 1. Vertigo Take meclizine for one month and see if symptoms resolve or persist.  - meclizine (ANTIVERT) 25 MG tablet; Take 1 tablet (25 mg total) by mouth daily.  Dispense: 30 tablet; Refill: 0  2. GAD (generalized anxiety disorder) Pt has significant anxiety, with history of depression and anorexia.  Will likely need psych referral in future to manage acute issues.  She is willing to stop her Ativan, and only use klonopin and Atarax as prescribed.  - clonazePAM (KLONOPIN) 0.5 MG tablet; Take 1 tablet (0.5 mg total) by mouth 2 (two) times daily as needed for anxiety.  Dispense: 60 tablet; Refill: 1 - hydrOXYzine (ATARAX/VISTARIL) 25 MG tablet; Take 1 tablet (25 mg) three times daily for anxiety symptoms  Dispense: 90 tablet; Refill: 0  3. Chronic constipation Pt manages with Senna, and OTC Pearls for constipation. Denies issues at this time.   4. Fatigue, unspecified type Monitor lab results - CBC with Differential/Platelet - Lipid Panel With LDL/HDL Ratio - TSH - T4, free - Comprehensive metabolic panel - Z61 and Folate Panel - Vitamin D (25 hydroxy) - Fe+TIBC+Fer  5. Anorexia nervosa in remission Pt is eating, will monitor weight closely.  She works out and exercises 5-6 days a week.   6. Tobacco dependence Smoking cessation counseling: 1. Pt acknowledges the risks of long term smoking, she will try to quite smoking. 2. Options for different medications including nicotine products, chewing gum, patch etc, Wellbutrin and Chantix is discussed 3. Goal and date of compete  cessation is discussed 4. Total time spent in smoking cessation is 15 min.   General Counseling: Reba verbalizes understanding of the findings of todays visit and agrees with plan of treatment. I have discussed any further diagnostic evaluation that may be needed or ordered today. We also reviewed her medications today. she has been encouraged to call the office with any questions or concerns that should arise related to todays visit.  Orders Placed This Encounter  Procedures  . CBC with Differential/Platelet  . Lipid Panel With LDL/HDL Ratio  . TSH  . T4,  free  . Comprehensive metabolic panel  . B12 and Folate Panel  . Vitamin D (25 hydroxy)  . Fe+TIBC+Fer    Meds ordered this encounter  Medications  . clonazePAM (KLONOPIN) 0.5 MG tablet    Sig: Take 1 tablet (0.5 mg total) by mouth 2 (two) times daily as needed for anxiety.    Dispense:  60 tablet    Refill:  1  . hydrOXYzine (ATARAX/VISTARIL) 25 MG tablet    Sig: Take 1 tablet (25 mg) three times daily for anxiety symptoms    Dispense:  90 tablet    Refill:  0  . meclizine (ANTIVERT) 25 MG tablet    Sig: Take 1 tablet (25 mg total) by mouth daily.    Dispense:  30 tablet    Refill:  0    Time spent: 25 Minutes   This patient was seen by Blima Ledger AGNP-C in Collaboration with Dr Lyndon Code as a part of collaborative care agreement

## 2018-05-24 NOTE — Patient Instructions (Signed)

## 2018-05-30 NOTE — Progress Notes (Signed)
SCANNED IN NEW PT PAPERWORK SIGNED ON 05/24/18.

## 2018-06-21 ENCOUNTER — Encounter: Payer: Self-pay | Admitting: Adult Health

## 2018-06-21 ENCOUNTER — Ambulatory Visit: Payer: 59 | Admitting: Adult Health

## 2018-06-21 ENCOUNTER — Other Ambulatory Visit: Payer: Self-pay | Admitting: Adult Health

## 2018-06-21 VITALS — BP 118/80 | HR 80 | Resp 16 | Ht 63.0 in | Wt 125.0 lb

## 2018-06-21 DIAGNOSIS — F411 Generalized anxiety disorder: Secondary | ICD-10-CM | POA: Diagnosis not present

## 2018-06-21 DIAGNOSIS — F17200 Nicotine dependence, unspecified, uncomplicated: Secondary | ICD-10-CM

## 2018-06-21 DIAGNOSIS — R42 Dizziness and giddiness: Secondary | ICD-10-CM

## 2018-06-21 DIAGNOSIS — F5000 Anorexia nervosa, unspecified: Secondary | ICD-10-CM

## 2018-06-21 MED ORDER — MECLIZINE HCL 25 MG PO TABS: 25.0000 mg | ORAL_TABLET | Freq: Every day | ORAL | 0 refills | Status: DC

## 2018-06-21 NOTE — Progress Notes (Signed)
[facility] [address] [place], [place] [address]  Internal MEDICINE  Office Visit Note  Patient Name: Elaine Warner  [id number]  [id number]  Date of Service: 06/21/2018  Chief Complaint  Patient presents with  . Anxiety  . Dizziness    has improved becoming less frequent     HPI Pt is here for follow up on Anxiety, and vertigo. At last visit her long time Lorazepam was replaced with Clonazepam and hydroxyzine.  She reports excellent results with this combination.  She states she is sleeping well, and while she continues to have episodes of increased anxiety, she feels they are under better control at this time. Her episodes of vertigo are becoming less frequent and she reports being able to go 3-5 days between attacks.  She continues to take the meclizine as needed. She would like a refill at this time. Unfortunately she continues to smoke, about 1/2 PPD of cigarettes.  She has also expressed that she needs a psychiatrist for her anorexia. She is going to investigate and see who she would like to see and we will make the referral at our next visit.       Current Medication: Outpatient Encounter Medications as of 06/21/2018  Medication Sig  . clonazePAM (KLONOPIN) 0.5 MG tablet Take 1 tablet (0.5 mg total) by mouth 2 (two) times daily as needed for anxiety.  Marland Kitchen etonogestrel-ethinyl estradiol (NUVARING) 0.12-0.015 MG/24HR vaginal ring Place 1 each vaginally every 28 (twenty-eight) days. Insert vaginally and leave in place for 3 consecutive weeks, then remove for 1 week.  . folic acid (FOLVITE) 0.5 MG tablet Take 0.5 mg by mouth daily.  Marland Kitchen gabapentin (NEURONTIN) 100 MG capsule Take 100 mg by mouth 3 (three) times daily.  . hydrOXYzine (ATARAX/VISTARIL) 25 MG tablet Take 1 tablet (25 mg) three times daily for anxiety symptoms  . meclizine (ANTIVERT) 25 MG tablet Take 1 tablet (25 mg total) by mouth daily.  . meloxicam (MOBIC) 15 MG tablet Take 1 tablet (15 mg total) by  mouth daily.  . [DISCONTINUED] cephALEXin (KEFLEX) 500 MG capsule Take 1 capsule (500 mg total) by mouth 3 (three) times daily. (Patient not taking: Reported on 05/24/2018)  . [DISCONTINUED] LORazepam (ATIVAN) 0.5 MG tablet Take 0.5 mg by mouth every 8 (eight) hours.  . [DISCONTINUED] meclizine (ANTIVERT) 25 MG tablet Take 1 tablet (25 mg total) by mouth daily.  . [DISCONTINUED] orphenadrine (NORFLEX) 100 MG tablet Take 1 tablet (100 mg total) by mouth 2 (two) times daily. (Patient not taking: Reported on 05/24/2018)  . [DISCONTINUED] sulfamethoxazole-trimethoprim (BACTRIM DS,SEPTRA DS) 800-160 MG tablet Take 1 tablet by mouth 2 (two) times daily. (Patient not taking: Reported on 05/24/2018)   No facility-administered encounter medications on file as of 06/21/2018.     Surgical History: Past Surgical History:  Procedure Laterality Date  . BREAST SURGERY      Medical History: Past Medical History:  Diagnosis Date  . Anorexia   . Depression     Family History: History reviewed. No pertinent family history.  Social History   Socioeconomic History  . Marital status: Single    Spouse name: Not on file  . Number of children: Not on file  . Years of education: Not on file  . Highest education level: Not on file  Occupational History  . Not on file  Social Needs  . Financial resource strain: Not on file  . Food insecurity:    Worry: Not on file    Inability:  Not on file  . Transportation needs:    Medical: Not on file    Non-medical: Not on file  Tobacco Use  . Smoking status: Current Every Day Smoker    Packs/day: 0.50    Years: 5.00    Pack years: 2.50    Types: Cigarettes  . Smokeless tobacco: Never Used  Substance and Sexual Activity  . Alcohol use: Yes    Alcohol/week: 0.0 standard drinks    Comment: occasionally  . Drug use: Yes    Comment: opiates/benzos  . Sexual activity: Yes    Birth control/protection: None  Lifestyle  . Physical activity:    Days per  week: Not on file    Minutes per session: Not on file  . Stress: Not on file  Relationships  . Social connections:    Talks on phone: Not on file    Gets together: Not on file    Attends religious service: Not on file    Active member of club or organization: Not on file    Attends meetings of clubs or organizations: Not on file    Relationship status: Not on file  . Intimate partner violence:    Fear of current or ex partner: Not on file    Emotionally abused: Not on file    Physically abused: Not on file    Forced sexual activity: Not on file  Other Topics Concern  . Not on file  Social History Narrative  . Not on file      Review of Systems  Constitutional: Negative for chills, fatigue and unexpected weight change.  HENT: Negative for congestion, rhinorrhea, sneezing and sore throat.   Eyes: Negative for photophobia, pain and redness.  Respiratory: Negative for cough, chest tightness and shortness of breath.   Cardiovascular: Negative for chest pain and palpitations.  Gastrointestinal: Negative for abdominal pain, constipation, diarrhea, nausea and vomiting.  Endocrine: Negative.   Genitourinary: Negative for dysuria and frequency.  Musculoskeletal: Negative for arthralgias, back pain, joint swelling and neck pain.  Skin: Negative for rash.  Allergic/Immunologic: Negative.   Neurological: Negative for tremors and numbness.  Hematological: Negative for adenopathy. Does not bruise/bleed easily.  Psychiatric/Behavioral: Negative for behavioral problems and sleep disturbance. The patient is not nervous/anxious.     Vital Signs: BP 118/80   Pulse 80   Resp 16   Ht 5\' 3"  (1.6 m)   Wt 125 lb (56.7 kg)   SpO2 99%   BMI 22.14 kg/m    Physical Exam  Constitutional: She is oriented to person, place, and time. She appears well-developed and well-nourished. No distress.  HENT:  Head: Normocephalic and atraumatic.  Mouth/Throat: Oropharynx is clear and moist. No  oropharyngeal exudate.  Eyes: Pupils are equal, round, and reactive to light. EOM are normal.  Neck: Normal range of motion. Neck supple. No JVD present. No tracheal deviation present. No thyromegaly present.  Cardiovascular: Normal rate, regular rhythm and normal heart sounds. Exam reveals no gallop and no friction rub.  No murmur heard. Pulmonary/Chest: Effort normal and breath sounds normal. No respiratory distress. She has no wheezes. She has no rales. She exhibits no tenderness.  Abdominal: Soft. There is no tenderness. There is no guarding.  Musculoskeletal: Normal range of motion.  Lymphadenopathy:    She has no cervical adenopathy.  Neurological: She is alert and oriented to person, place, and time. No cranial nerve deficit.  Skin: Skin is warm and dry. She is not diaphoretic.  Psychiatric: She has a  normal mood and affect. Her behavior is normal. Judgment and thought content normal.  Nursing note and vitals reviewed.  Assessment/Plan: 1. GAD (generalized anxiety disorder) Stable, pt reports better control on clonazepam, and Hydroxyzine. Will continue this regimen currently, and patient will be referred to the Psychiatrist of her choice at next visit. If she is unable to provide a name, we will refer her to [facility] psych, and they can assist her with appropriate follow up.   2. Vertigo Improving, will re-evaluate in 4 weeks. If not improved, Referral to ENT will be considered.  - meclizine (ANTIVERT) 25 MG tablet; Take 1 tablet (25 mg total) by mouth daily.  Dispense: 30 tablet; Refill: 0  3. Tobacco dependence Pt is attempting to cut down on her smoking.  Once again encouraged patient, and discussed long term side effects of smoking.  Smoking cessation counseling: 1. Pt acknowledges the risks of long term smoking, she will try to quite smoking. 2. Options for different medications including nicotine products, chewing gum, patch etc, Wellbutrin and Chantix is discussed 3. Goal  and date of compete cessation is discussed 4. Total time spent in smoking cessation is 15 min.   4. Anorexia nervosa in remission Pt in Remission, denies current issues.  She does reports exercising at the gym 4-6 times a week.  I have encouraged her to establish a relationship with a provider locally, and she has agreed that this is a good idea.   General Counseling: Niyah verbalizes understanding of the findings of todays visit and agrees with plan of treatment. I have discussed any further diagnostic evaluation that may be needed or ordered today. We also reviewed her medications today. she has been encouraged to call the office with any questions or concerns that should arise related to todays visit.    No orders of the defined types were placed in this encounter.   Meds ordered this encounter  Medications  . meclizine (ANTIVERT) 25 MG tablet    Sig: Take 1 tablet (25 mg total) by mouth daily.    Dispense:  30 tablet    Refill:  0    Time spent: 25 Minutes   This patient was seen by Blima Ledger AGNP-C in Collaboration with Dr Lyndon Code as a part of collaborative care agreement     Johnna Acosta AGNP-C Internal medicine

## 2018-06-21 NOTE — Patient Instructions (Signed)

## 2018-06-22 ENCOUNTER — Ambulatory Visit: Payer: Self-pay | Admitting: Adult Health

## 2018-07-19 ENCOUNTER — Encounter: Payer: Self-pay | Admitting: Adult Health

## 2018-07-19 ENCOUNTER — Ambulatory Visit: Payer: 59 | Admitting: Adult Health

## 2018-07-19 VITALS — BP 114/74 | HR 85 | Resp 16 | Ht 63.0 in | Wt 124.0 lb

## 2018-07-19 DIAGNOSIS — R42 Dizziness and giddiness: Secondary | ICD-10-CM

## 2018-07-19 DIAGNOSIS — F5000 Anorexia nervosa, unspecified: Secondary | ICD-10-CM | POA: Diagnosis not present

## 2018-07-19 DIAGNOSIS — F411 Generalized anxiety disorder: Secondary | ICD-10-CM

## 2018-07-19 DIAGNOSIS — F17200 Nicotine dependence, unspecified, uncomplicated: Secondary | ICD-10-CM

## 2018-07-19 MED ORDER — CLONAZEPAM 0.5 MG PO TABS: 0.5000 mg | ORAL_TABLET | Freq: Two times a day (BID) | ORAL | 0 refills | Status: DC | PRN

## 2018-07-19 NOTE — Progress Notes (Signed)
[facility] [address] [place], [place] [address]  Internal MEDICINE  Office Visit Note  Patient Name: Elaine Warner  [id number]  [id number]  Date of Service: 07/19/2018  Chief Complaint  Patient presents with  . Anxiety    HPI Pt is here for follow up on anxiety and vertigo.  She reports that her vertigo is much improved at this time and she is not in need of meclizine.  She does report however that her anxiety is becoming worse.  She states that she contributes this mostly to the holidays and starting a new job.  She also reports that she broke up with her longtime boyfriend a few days ago and has been feeling emotional.  She denies any SI or HI.   She has decreased her smoking and reports that she has been doing very well with that.  She is a luck finding a psychiatrist that she would like to see to assist [place] with managing her anxiety and medications.  Therefore a psychiatry referral will be placed and she will see who we can get her in to see.    Current Medication: Outpatient Encounter Medications as of 07/19/2018  Medication Sig  . clonazePAM (KLONOPIN) 0.5 MG tablet Take 1 tablet (0.5 mg total) by mouth 2 (two) times daily as needed for anxiety.  Marland Kitchen etonogestrel-ethinyl estradiol (NUVARING) 0.12-0.015 MG/24HR vaginal ring Place 1 each vaginally every 28 (twenty-eight) days. Insert vaginally and leave in place for 3 consecutive weeks, then remove for 1 week.  . folic acid (FOLVITE) 0.5 MG tablet Take 0.5 mg by mouth daily.  Marland Kitchen gabapentin (NEURONTIN) 100 MG capsule Take 100 mg by mouth 3 (three) times daily.  . hydrOXYzine (ATARAX/VISTARIL) 25 MG tablet Take 1 tablet (25 mg) three times daily for anxiety symptoms  . meclizine (ANTIVERT) 25 MG tablet Take 1 tablet (25 mg total) by mouth daily.  . meloxicam (MOBIC) 15 MG tablet Take 1 tablet (15 mg total) by mouth daily.  . [DISCONTINUED] clonazePAM (KLONOPIN) 0.5 MG tablet Take 1 tablet (0.5 mg total) by mouth 2  (two) times daily as needed for anxiety.   No facility-administered encounter medications on file as of 07/19/2018.     Surgical History: Past Surgical History:  Procedure Laterality Date  . BREAST SURGERY      Medical History: Past Medical History:  Diagnosis Date  . Anorexia   . Depression     Family History: History reviewed. No pertinent family history.  Social History   Socioeconomic History  . Marital status: Single    Spouse name: Not on file  . Number of children: Not on file  . Years of education: Not on file  . Highest education level: Not on file  Occupational History  . Not on file  Social Needs  . Financial resource strain: Not on file  . Food insecurity:    Worry: Not on file    Inability: Not on file  . Transportation needs:    Medical: Not on file    Non-medical: Not on file  Tobacco Use  . Smoking status: Current Every Day Smoker    Packs/day: 0.50    Years: 5.00    Pack years: 2.50    Types: Cigarettes  . Smokeless tobacco: Never Used  Substance and Sexual Activity  . Alcohol use: Yes    Alcohol/week: 0.0 standard drinks    Comment: occasionally  . Drug use: Yes    Comment: opiates/benzos  . Sexual activity: Yes  Birth control/protection: None  Lifestyle  . Physical activity:    Days per week: Not on file    Minutes per session: Not on file  . Stress: Not on file  Relationships  . Social connections:    Talks on phone: Not on file    Gets together: Not on file    Attends religious service: Not on file    Active member of club or organization: Not on file    Attends meetings of clubs or organizations: Not on file    Relationship status: Not on file  . Intimate partner violence:    Fear of current or ex partner: Not on file    Emotionally abused: Not on file    Physically abused: Not on file    Forced sexual activity: Not on file  Other Topics Concern  . Not on file  Social History Narrative  . Not on file      Review  of Systems  Constitutional: Negative for chills, fatigue and unexpected weight change.  HENT: Negative for congestion, rhinorrhea, sneezing and sore throat.   Eyes: Negative for photophobia, pain and redness.  Respiratory: Negative for cough, chest tightness and shortness of breath.   Cardiovascular: Negative for chest pain and palpitations.  Gastrointestinal: Negative for abdominal pain, constipation, diarrhea, nausea and vomiting.  Endocrine: Negative.   Genitourinary: Negative for dysuria and frequency.  Musculoskeletal: Negative for arthralgias, back pain, joint swelling and neck pain.  Skin: Negative for rash.  Allergic/Immunologic: Negative.   Neurological: Negative for tremors and numbness.  Hematological: Negative for adenopathy. Does not bruise/bleed easily.  Psychiatric/Behavioral: Negative for behavioral problems and sleep disturbance. The patient is not nervous/anxious.     Vital Signs: BP 114/74 (BP Location: Left Arm, Patient Position: Sitting, Cuff Size: Normal)   Pulse 85   Resp 16   Ht 5\' 3"  (1.6 m)   Wt 124 lb (56.2 kg)   SpO2 98%   BMI 21.97 kg/m    Physical Exam  Constitutional: She is oriented to person, place, and time. She appears well-developed and well-nourished. No distress.  HENT:  Head: Normocephalic and atraumatic.  Mouth/Throat: Oropharynx is clear and moist. No oropharyngeal exudate.  Eyes: Pupils are equal, round, and reactive to light. EOM are normal.  Neck: Normal range of motion. Neck supple. No JVD present. No tracheal deviation present. No thyromegaly present.  Cardiovascular: Normal rate, regular rhythm and normal heart sounds. Exam reveals no gallop and no friction rub.  No murmur heard. Pulmonary/Chest: Effort normal and breath sounds normal. No respiratory distress. She has no wheezes. She has no rales. She exhibits no tenderness.  Abdominal: Soft. There is no tenderness. There is no guarding.  Musculoskeletal: Normal range of motion.   Lymphadenopathy:    She has no cervical adenopathy.  Neurological: She is alert and oriented to person, place, and time. No cranial nerve deficit.  Skin: Skin is warm and dry. She is not diaphoretic.  Psychiatric: She has a normal mood and affect. Her behavior is normal. Judgment and thought content normal.  Nursing note and vitals reviewed.  Assessment/Plan: 1. Vertigo Improved, continue to monitor in the future.  2. GAD (generalized anxiety disorder) Patient referral to psychiatry to establish care.  Refill patient's clonazepam and told patient that would prefer psychiatry to refill from this point on. - Ambulatory referral to Psychiatry - clonazePAM (KLONOPIN) 0.5 MG tablet; Take 1 tablet (0.5 mg total) by mouth 2 (two) times daily as needed for anxiety.  Dispense:  60 tablet; Refill: 0  3. Anorexia nervosa in remission Patient denies any issues with her anorexia at this time.  She has not lost or gained any weight and continues to go to the gym 3-4 times a week.  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 - Ambulatory referral to Psychiatry  4. Tobacco dependence Smoking cessation counseling: 1. Pt acknowledges the risks of long term smoking, she will try to quite smoking. 2. Options for different medications including nicotine products, chewing gum, patch etc, Wellbutrin and Chantix is discussed 3. Goal and date of compete cessation is discussed 4. Total time spent in smoking cessation is 15 min.   General Counseling: Berenize verbalizes understanding of the findings of todays visit and agrees with plan of treatment. I have discussed any further diagnostic evaluation that may be needed or ordered today. We also reviewed her medications today. she has been encouraged to call the office with any questions or concerns that should arise related to todays visit.    Orders Placed This Encounter  Procedures  . Ambulatory referral to Psychiatry    Meds  ordered this encounter  Medications  . clonazePAM (KLONOPIN) 0.5 MG tablet    Sig: Take 1 tablet (0.5 mg total) by mouth 2 (two) times daily as needed for anxiety.    Dispense:  60 tablet    Refill:  0    Time spent:25 Minutes   This patient was seen by Blima Ledger AGNP-C in Collaboration with Dr Lyndon Code as a part of collaborative care agreement     Johnna Acosta AGNP-C Internal medicine

## 2018-07-19 NOTE — Patient Instructions (Signed)

## 2018-08-28 ENCOUNTER — Ambulatory Visit: Payer: Self-pay | Admitting: Adult Health

## 2018-10-12 ENCOUNTER — Encounter: Payer: Self-pay | Admitting: Adult Health

## 2018-10-12 ENCOUNTER — Ambulatory Visit: Payer: 59 | Admitting: Adult Health

## 2018-10-12 VITALS — BP 122/86 | HR 76 | Resp 16 | Ht 63.0 in | Wt 126.0 lb

## 2018-10-12 DIAGNOSIS — R42 Dizziness and giddiness: Secondary | ICD-10-CM | POA: Diagnosis not present

## 2018-10-12 DIAGNOSIS — R630 Anorexia: Secondary | ICD-10-CM

## 2018-10-12 DIAGNOSIS — F411 Generalized anxiety disorder: Secondary | ICD-10-CM | POA: Diagnosis not present

## 2018-10-12 DIAGNOSIS — F17200 Nicotine dependence, unspecified, uncomplicated: Secondary | ICD-10-CM

## 2018-10-12 MED ORDER — HYDROXYZINE HCL 25 MG PO TABS: ORAL_TABLET | ORAL | 0 refills | Status: DC

## 2018-10-12 MED ORDER — CLONAZEPAM 0.5 MG PO TABS: 0.5000 mg | ORAL_TABLET | Freq: Two times a day (BID) | ORAL | 1 refills | Status: AC | PRN

## 2018-10-12 MED ORDER — MECLIZINE HCL 25 MG PO TABS
25.0000 mg | ORAL_TABLET | Freq: Every day | ORAL | 0 refills | Status: AC
Start: 2018-10-12 — End: ?

## 2018-10-12 NOTE — Patient Instructions (Signed)
Anorexia Nervosa Anorexia nervosa is an eating disorder that results in a lower-than-normal body weight. The disorder usually starts in the teenage years. People with anorexia nervosa are intensely afraid of gaining weight or being fat. They often see themselves as fat even though they may be very thin. To prevent weight gain or to lose weight, they engage in unhealthy behaviors, such as:  Starving themselves.  Fasting.  Exercising too much.  Trying to get rid of food they have eaten (purging), such as by: ? Making themselves throw up (vomit) after eating. ? Using laxatives or enemas. These behaviors often interfere with normal life activities. They can lead to serious medical problems and even death. People with anorexia nervosa are also at risk for substance abuse and death due to suicide. What are the causes? Common causes of this condition include:  Depression and other psychological problems.  Pressure from peers and society to have a thin body.  Hormone changes at puberty.  Stress.  Factors that are inherited from family (genetics). What increases the risk? The following factors may make you more likely to develop this condition:  Being a teenager.  Being female. Anorexia nervosa can affect males, but it is more common in females.  Having a mental health disorder, such as depression or anxiety.  Having a family member who has an eating disorder.  Participating in sports or activities that put an emphasis on weight and appearance, such as dancing, cheerleading, running, ice-skating, gymnastics, wrestling, or modeling.  Feeling anxious or tending to be obsessive, even as a young child.  Thinking a lot about being perfect and following rules. What are the signs or symptoms? Symptoms of this condition include:  Changes in appearance. These may include hair loss, dry hair and skin, spotty skin, brittle nails, and a thin layer of hair covering the skin  (lanugo).  Discolored teeth or cavities.  Loss of muscle and fat.  Lower-than-normal body weight.  Low blood pressure.  Slow heart rate.  Feeling cold all the time.  Fatigue.  Constipation.  Missed menstrual periods. Other symptoms include:  An intense fear of gaining weight or being fat.  Having a distorted body image, such as thinking that you are fat when you are not.  Basing your self-worth on being thin.  Wearing lots of clothes to hide your body.  Being in denial that your low body weight is a problem.  Eating in secret.  Binge eating.  Checking your body frequently by: ? Looking in a mirror. ? Pinching the skin on the sides of your body. ? Weighing yourself.  Doing things that prevent weight gain, such as: ? Limiting (restricting) your calorie intake, starving yourself, or not eating for a long period of time (fasting). ? Purging. ? Exercising excessively. How is this diagnosed? This condition may be diagnosed based on:  An assessment by your health care provider. He or she may ask about: ? Your thoughts, feelings, and eating habits. ? Your symptoms. ? Your use of medicine, alcohol, or other substances.  Measurements of your weight.  Checking your body temperature, pulse, blood pressure, and breathing rate (vital signs).  Results of a physical exam. Your health care provider may also order tests or studies to look for health problems. You may be referred to a mental health specialist for evaluation. After you have been diagnosed, your level of anorexia nervosa will be rated from mild to severe. The rating depends on your degree of weight loss compared to other people  of your age, gender, and stage of development. How is this treated?  The first goal of treatment is to stabilize medical problems and mental health issues that are related to the disorder. The type and length of treatment will depend on your specific situation. You may need to be  treated at the hospital if you have:  Severe malnutrition.  Dehydration.  An imbalance in important chemicals (electrolytes) in your body.  An abnormal heart rhythm.  Depression.  Suicidal thoughts. The second goal of treatment is to restore your body weight to a healthy level. Successful treatment usually requires a combination of:  Close monitoring of weight and feeding (behavioral feeding plan) to increase your calorie intake.  Counseling with a diet and nutrition specialist (dietitian).  Talk therapy or counseling with a mental health specialist. A form of talk therapy called cognitive behavioral therapy (CBT) can be especially helpful. This therapy helps you recognize the thoughts, beliefs, and emotions that lead to unhealthy eating habits and helps you change them.  Medicines. Some people with severe anorexia nervosa may benefit from certain medicines that help them to gain weight. Antidepressant medicines can help with symptoms of depression and anxiety. For adolescents, Family-Based Treatment (FBT) may also be helpful. This is a form of therapy in which your family is invited to have an active role in your treatment and recovery. Follow these instructions at home:  Take over-the-counter and prescription medicines only as told by your health care provider.  Follow a meal plan as directed by your health care provider.  Avoid checking your weight.  Avoid isolating yourself from your family and friends, especially during mealtimes.  Keep all follow-up visits as told by your health care provider. This is important. Where to find more information  National Eating Disorders Association (NEDA): www.nationaleatingdisorders.org  [employer] Mental Illness: www.nami.org  [place] Department of Health and Human Services: https://www.vaughan-marshall.com/ Contact a health care provider if:  Your symptoms get worse.  You start having new symptoms. Get help right away if:  You fall  down (collapse) or lose consciousness (faint) because of exhaustion or malnutrition.  You have chest pain or abnormal heart rhythms.  You vomit blood.  You have serious thoughts about hurting yourself or someone else. If you ever feel like you may hurt yourself or others, or have thoughts about taking your own life, get help right away. You can go to your nearest emergency department or call:  Your local emergency services (911 in the [place]  A suicide crisis helpline, such as the National Suicide Prevention Lifeline at 202-153-1472. This is open 24 hours a day. Summary  Anorexia nervosa is a serious condition. Low body weight is its main symptom.  People who have anorexia nervosa often think that they are overweight (body image disturbance). They may even try to lose weight by denying themselves food, try to get rid of the food they have eaten (purge), or use certain medicines to prevent weight gain.  If anorexia nervosa is not treated, it can lead to death.  Effective treatments usually involve close monitoring of weight and feeding (behavioral feeding plans) and cognitive behavioral interventions. In some cases, treatment may include medicines. This information is not intended to replace advice given to you by your health care provider. Make sure you discuss any questions you have with your health care provider. Document Released: 08/06/2000 Document Revised: 04/06/2017 Document Reviewed: 04/06/2017 Elsevier Interactive Patient Education  2019 [employer].

## 2018-10-12 NOTE — Progress Notes (Signed)
[facility] [address] [place], [place] [address]  Internal MEDICINE  Office Visit Note  Patient Name: Elaine Warner  [id number]  [id number]  Date of Service: 10/12/2018  Chief Complaint  Patient presents with  . Anxiety    HPI  Pt is here for follow up on anxiety, and vertigo.     at her last visit she was referred to psychiatry.  She has had some difficulty obtaining a psychiatrist.  She has ran out of medication and is requesting refills at this time.  She reports that her vertigo was improving however she is been allocations now for a few weeks and she feels like the stress of worrying about getting a psychiatrist has called back to increase again.  She is using nicotine patches currently and is down to 2 cigarettes a day and feels like she is being successful at this time.  Current Medication: Outpatient Encounter Medications as of 10/12/2018  Medication Sig  . clonazePAM (KLONOPIN) 0.5 MG tablet Take 1 tablet (0.5 mg total) by mouth 2 (two) times daily as needed for anxiety.  Marland Kitchen etonogestrel-ethinyl estradiol (NUVARING) 0.12-0.015 MG/24HR vaginal ring Place 1 each vaginally every 28 (twenty-eight) days. Insert vaginally and leave in place for 3 consecutive weeks, then remove for 1 week.  . gabapentin (NEURONTIN) 100 MG capsule Take 100 mg by mouth 3 (three) times daily.  . hydrOXYzine (ATARAX/VISTARIL) 25 MG tablet Take 1 tablet (25 mg) three times daily for anxiety symptoms  . meclizine (ANTIVERT) 25 MG tablet Take 1 tablet (25 mg total) by mouth daily.  . [DISCONTINUED] clonazePAM (KLONOPIN) 0.5 MG tablet Take 1 tablet (0.5 mg total) by mouth 2 (two) times daily as needed for anxiety.  . [DISCONTINUED] hydrOXYzine (ATARAX/VISTARIL) 25 MG tablet Take 1 tablet (25 mg) three times daily for anxiety symptoms  . [DISCONTINUED] meclizine (ANTIVERT) 25 MG tablet Take 1 tablet (25 mg total) by mouth daily.  . meloxicam (MOBIC) 15 MG tablet Take 1 tablet (15 mg total)  by mouth daily. (Patient not taking: Reported on 10/12/2018)  . [DISCONTINUED] folic acid (FOLVITE) 0.5 MG tablet Take 0.5 mg by mouth daily.   No facility-administered encounter medications on file as of 10/12/2018.     Surgical History: Past Surgical History:  Procedure Laterality Date  . BREAST SURGERY      Medical History: Past Medical History:  Diagnosis Date  . Anorexia   . Depression     Family History: History reviewed. No pertinent family history.  Social History   Socioeconomic History  . Marital status: Single    Spouse name: Not on file  . Number of children: Not on file  . Years of education: Not on file  . Highest education level: Not on file  Occupational History  . Not on file  Social Needs  . Financial resource strain: Not on file  . Food insecurity:    Worry: Not on file    Inability: Not on file  . Transportation needs:    Medical: Not on file    Non-medical: Not on file  Tobacco Use  . Smoking status: Current Every Day Smoker    Packs/day: 0.50    Years: 5.00    Pack years: 2.50    Types: Cigarettes  . Smokeless tobacco: Never Used  Substance and Sexual Activity  . Alcohol use: Yes    Alcohol/week: 0.0 standard drinks    Comment: occasionally  . Drug use: Yes    Comment: opiates/benzos  .  Sexual activity: Yes    Birth control/protection: None  Lifestyle  . Physical activity:    Days per week: Not on file    Minutes per session: Not on file  . Stress: Not on file  Relationships  . Social connections:    Talks on phone: Not on file    Gets together: Not on file    Attends religious service: Not on file    Active member of club or organization: Not on file    Attends meetings of clubs or organizations: Not on file    Relationship status: Not on file  . Intimate partner violence:    Fear of current or ex partner: Not on file    Emotionally abused: Not on file    Physically abused: Not on file    Forced sexual activity: Not on file   Other Topics Concern  . Not on file  Social History Narrative  . Not on file      Review of Systems  Constitutional: Negative for chills, fatigue and unexpected weight change.  HENT: Negative for congestion, rhinorrhea, sneezing and sore throat.   Eyes: Negative for photophobia, pain and redness.  Respiratory: Negative for cough, chest tightness and shortness of breath.   Cardiovascular: Negative for chest pain and palpitations.  Gastrointestinal: Negative for abdominal pain, constipation, diarrhea, nausea and vomiting.  Endocrine: Negative.   Genitourinary: Negative for dysuria and frequency.  Musculoskeletal: Negative for arthralgias, back pain, joint swelling and neck pain.  Skin: Negative for rash.  Allergic/Immunologic: Negative.   Neurological: Negative for tremors and numbness.  Hematological: Negative for adenopathy. Does not bruise/bleed easily.  Psychiatric/Behavioral: Negative for behavioral problems and sleep disturbance. The patient is not nervous/anxious.     Vital Signs: BP 122/86   Pulse 76   Resp 16   Ht 5\' 3"  (1.6 m)   Wt 126 lb (57.2 kg)   SpO2 99%   BMI 22.32 kg/m    Physical Exam Vitals signs and nursing note reviewed.  Constitutional:      General: She is not in acute distress.    Appearance: She is well-developed. She is not diaphoretic.  HENT:     Head: Normocephalic and atraumatic.     Mouth/Throat:     Pharynx: No oropharyngeal exudate.  Eyes:     Pupils: Pupils are equal, round, and reactive to light.  Neck:     Musculoskeletal: Normal range of motion and neck supple.     Thyroid: No thyromegaly.     Vascular: No JVD.     Trachea: No tracheal deviation.  Cardiovascular:     Rate and Rhythm: Normal rate and regular rhythm.     Heart sounds: Normal heart sounds. No murmur. No friction rub. No gallop.   Pulmonary:     Effort: Pulmonary effort is normal. No respiratory distress.     Breath sounds: Normal breath sounds. No wheezing or  rales.  Chest:     Chest wall: No tenderness.  Abdominal:     Palpations: Abdomen is soft.     Tenderness: There is no abdominal tenderness. There is no guarding.  Musculoskeletal: Normal range of motion.  Lymphadenopathy:     Cervical: No cervical adenopathy.  Skin:    General: Skin is warm and dry.  Neurological:     Mental Status: She is alert and oriented to person, place, and time.     Cranial Nerves: No cranial nerve deficit.  Psychiatric:  Behavior: Behavior normal.        Thought Content: Thought content normal.        Judgment: Judgment normal.    Assessment/Plan: 1. GAD (generalized anxiety disorder) Refilled patient's medications at this time while she continues to seek out psychiatry.  Patient overall is doing well when she can have her medications. - clonazePAM (KLONOPIN) 0.5 MG tablet; Take 1 tablet (0.5 mg total) by mouth 2 (two) times daily as needed for anxiety.  Dispense: 60 tablet; Refill: 1 - hydrOXYzine (ATARAX/VISTARIL) 25 MG tablet; Take 1 tablet (25 mg) three times daily for anxiety symptoms  Dispense: 90 tablet; Refill: 0  2. Anorexia Another referral placed for patient to get some distance with counseling to combat her anorexia. - Ambulatory referral to Psychiatry  3. Vertigo Refilled patient's meclizine prescription to help her with vertigo symptoms. - meclizine (ANTIVERT) 25 MG tablet; Take 1 tablet (25 mg total) by mouth daily.  Dispense: 30 tablet; Refill: 0  4. Tobacco dependence Unfortunately patient does continue to smoke 2 cigarettes a day she has cut down drastically and will continue to do so.  She is using nicotine patches to help her do this. Smoking cessation counseling: 1. Pt acknowledges the risks of long term smoking, she will try to quite smoking. 2. Options for different medications including nicotine products, chewing gum, patch etc, Wellbutrin and Chantix is discussed 3. Goal and date of compete cessation is  discussed 4. Total time spent in smoking cessation is 15 min.   General Counseling: Malavika verbalizes understanding of the findings of todays visit and agrees with plan of treatment. I have discussed any further diagnostic evaluation that may be needed or ordered today. We also reviewed her medications today. she has been encouraged to call the office with any questions or concerns that should arise related to todays visit.    Orders Placed This Encounter  Procedures  . Ambulatory referral to Psychiatry    Meds ordered this encounter  Medications  . clonazePAM (KLONOPIN) 0.5 MG tablet    Sig: Take 1 tablet (0.5 mg total) by mouth 2 (two) times daily as needed for anxiety.    Dispense:  60 tablet    Refill:  1  . meclizine (ANTIVERT) 25 MG tablet    Sig: Take 1 tablet (25 mg total) by mouth daily.    Dispense:  30 tablet    Refill:  0  . hydrOXYzine (ATARAX/VISTARIL) 25 MG tablet    Sig: Take 1 tablet (25 mg) three times daily for anxiety symptoms    Dispense:  90 tablet    Refill:  0    Time spent: 25 Minutes   This patient was seen by Blima Ledger AGNP-C in Collaboration with Dr Lyndon Code as a part of collaborative care agreement     Johnna Acosta AGNP-C Internal medicine

## 2018-10-19 ENCOUNTER — Ambulatory Visit: Payer: Self-pay | Admitting: Adult Health

## 2018-12-12 ENCOUNTER — Ambulatory Visit: Payer: Self-pay | Admitting: Nurse Practitioner

## 2018-12-12 ENCOUNTER — Ambulatory Visit: Payer: Self-pay | Admitting: Adult Health

## 2019-05-23 ENCOUNTER — Other Ambulatory Visit: Payer: Self-pay

## 2019-05-23 ENCOUNTER — Ambulatory Visit: Payer: 59 | Admitting: Adult Health

## 2019-05-23 ENCOUNTER — Encounter: Payer: Self-pay | Admitting: Emergency Medicine

## 2019-05-23 ENCOUNTER — Encounter: Payer: Self-pay | Admitting: Adult Health

## 2019-05-23 ENCOUNTER — Emergency Department
Admission: EM | Admit: 2019-05-23 | Discharge: 2019-05-23 | Disposition: A | Discharge status: Home / Self Care | Payer: 59 | Attending: Emergency Medicine | Admitting: Emergency Medicine

## 2019-05-23 VITALS — BP 110/80 | HR 70 | Temp 97.9°F | Resp 16 | Ht 63.0 in | Wt 126.0 lb

## 2019-05-23 DIAGNOSIS — R45851 Suicidal ideations: Secondary | ICD-10-CM | POA: Diagnosis not present

## 2019-05-23 DIAGNOSIS — F17210 Nicotine dependence, cigarettes, uncomplicated: Secondary | ICD-10-CM | POA: Diagnosis not present

## 2019-05-23 DIAGNOSIS — F411 Generalized anxiety disorder: Secondary | ICD-10-CM

## 2019-05-23 DIAGNOSIS — F17200 Nicotine dependence, unspecified, uncomplicated: Secondary | ICD-10-CM

## 2019-05-23 DIAGNOSIS — Z79899 Other long term (current) drug therapy: Secondary | ICD-10-CM | POA: Diagnosis not present

## 2019-05-23 DIAGNOSIS — Z008 Encounter for other general examination: Secondary | ICD-10-CM | POA: Insufficient documentation

## 2019-05-23 DIAGNOSIS — F419 Anxiety disorder, unspecified: Secondary | ICD-10-CM

## 2019-05-23 DIAGNOSIS — R4689 Other symptoms and signs involving appearance and behavior: Secondary | ICD-10-CM

## 2019-05-23 DIAGNOSIS — R4589 Other symptoms and signs involving emotional state: Secondary | ICD-10-CM

## 2019-05-23 DIAGNOSIS — F329 Major depressive disorder, single episode, unspecified: Secondary | ICD-10-CM | POA: Diagnosis present

## 2019-05-23 LAB — POCT URINE DRUG SCREEN
POC Amphetamine UR: NOT DETECTED
POC BENZODIAZEPINES UR: NOT DETECTED
POC Barbiturate UR: NOT DETECTED
POC Cocaine UR: POSITIVE — AB
POC Ecstasy UR: NOT DETECTED
POC Marijuana UR: NOT DETECTED
POC Methadone UR: NOT DETECTED
POC Methamphetamine UR: NOT DETECTED
POC Opiate Ur: NOT DETECTED
POC Oxycodone UR: NOT DETECTED
POC PHENCYCLIDINE UR: NOT DETECTED
POC TRICYCLICS UR: NOT DETECTED

## 2019-05-23 LAB — COMPREHENSIVE METABOLIC PANEL
ALT: 12 U/L (ref 0–44)
AST: 17 U/L (ref 15–41)
Albumin: 4.6 g/dL (ref 3.5–5.0)
Alkaline Phosphatase: 47 U/L (ref 38–126)
Anion gap: 8 (ref 5–15)
BUN: 15 mg/dL (ref 6–20)
CO2: 25 mmol/L (ref 22–32)
Calcium: 9.5 mg/dL (ref 8.9–10.3)
Chloride: 104 mmol/L (ref 98–111)
Creatinine, Ser: 0.8 mg/dL (ref 0.44–1.00)
GFR calc Af Amer: >60 mL/min (ref >60)
GFR calc non Af Amer: >60 mL/min (ref >60)
Glucose, Bld: 102 mg/dL — ABNORMAL HIGH (ref 70–99)
Potassium: 4.2 mmol/L (ref 3.5–5.1)
Sodium: 137 mmol/L (ref 135–145)
Total Bilirubin: 1 mg/dL (ref 0.3–1.2)
Total Protein: 7.3 g/dL (ref 6.5–8.1)

## 2019-05-23 LAB — CBC
HCT: 36.5 % (ref 36.0–46.0)
Hemoglobin: 12.2 g/dL (ref 12.0–15.0)
MCH: 31.4 pg (ref 26.0–34.0)
MCHC: 33.4 g/dL (ref 30.0–36.0)
MCV: 93.8 fL (ref 80.0–100.0)
Platelets: 246 10*3/uL (ref 150–400)
RBC: 3.89 MIL/uL (ref 3.87–5.11)
RDW: 12.6 % (ref 11.5–15.5)
WBC: 7.8 10*3/uL (ref 4.0–10.5)
nRBC: 0 % (ref 0.0–0.2)

## 2019-05-23 LAB — SALICYLATE LEVEL: Salicylate Lvl: <7 mg/dL (ref 2.8–30.0)

## 2019-05-23 LAB — ACETAMINOPHEN LEVEL: Acetaminophen (Tylenol), Serum: <10 ug/mL — ABNORMAL LOW (ref 10–30)

## 2019-05-23 LAB — ETHANOL: Alcohol, Ethyl (B): <10 mg/dL (ref <10)

## 2019-05-23 NOTE — BH Assessment (Signed)
Assessment Note  Elaine Warner is an 28 y.o. female who presents to the ER, after she was advised to do so by her PCP. Patient told her PCP, approximately three months ago, she was going to jump off a building in order to end her. PCP was concerned and had her come to the ER voluntarily and if she refused, he would have placed her under IVC. Patient further explained, she told her PCP about the incident as a means to get them to prescribed a higher dosage of klonopin. Patient currently denies any SI. She states, "I have too much to do and my grandmother depends on me." During the time of the event, she found out her boyfriend slept with someone else, during the time they wasn't together. When he told her this, she felt no lone loved her and no one cared and she was going to jump off of the boyfriend's balcony, not the roof. The boyfriend pulled her back and she wasn't harm.  Patient currently receives mental health outpatient treatment with the [facility], in [place], [place]. They are the one's who prescribed her Klonopin. She states she have an appointment with them in several weeks. She was hoping her PCP, would have prescribed them today (05/23/2019) while she was in their office.  During the interview, the patient was calm, cooperative and pleasant. She was able to provide appropriate answers to the questions. She admits to the use of cocaine and alcohol use.   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 Writer provided the patient with resources to connect with, in regards to the care for her grandmother.  Diagnosis: Anxiety   Past Medical History:  Past Medical History:  Diagnosis Date  . Anorexia   . Anorexia   . Depression     Past Surgical History:  Procedure Laterality Date  . BREAST SURGERY      Family History: No family history on file.  Social History:  reports that  she has been smoking cigarettes. She has a 2.50 pack-year smoking history. She has never used smokeless tobacco. She reports current alcohol use. She reports current drug use. Drug: Cocaine.  Additional Social History:  Alcohol / Drug Use Pain Medications: See PTA Prescriptions: See PTA Over the Counter: See PTA History of alcohol / drug use?: Yes Longest period of sobriety (when/how long): Unable to quantify Substance #1 Name of Substance 1: Cocaine 1 - Last Use / Amount: "Three days ago" Substance #2 Name of Substance 2: Alcohol 2 - Last Use / Amount: Unable to quantify  CIWA: CIWA-Ar BP: 119/89 Pulse Rate: 78 COWS:    Allergies:  Allergies  Allergen Reactions  . Lactose Intolerance (Gi) Other (See Comments)    Bloating     Home Medications: (Not in a hospital admission)   OB/GYN Status:  No LMP recorded. (Menstrual status: Other).  General Assessment Data Location of Assessment: [facility] ED TTS Assessment: In system Is this a Tele or Face-to-Face Assessment?: Face-to-Face Is this an Initial Assessment or a Re-assessment for this encounter?: Initial Assessment Patient Accompanied by:: N/A Language Other than English: No Living Arrangements: Other (Comment)(Private Home) What gender do you identify as?: Female Marital status: Long term relationship Pregnancy Status: No Living Arrangements: Other relatives(Lives with Grandmother) Can pt return to current living arrangement?: Yes Admission Status: Voluntary Is patient capable of signing voluntary admission?: Yes Referral Source: Other(Her  PCP) Insurance type: Product/process development scientist Exam [facility] Walk-in ONLY) Medical Exam completed: Yes  Crisis Care Plan Living Arrangements: Other relatives(Lives with Grandmother) Legal Guardian: Other:(Self) Name of Psychiatrist: [facility] [place], [place]) Name of Therapist: [facility] [place], [place])  Education Status Is patient currently in school?: No Is the patient  employed, unemployed or receiving disability?: Employed(Self-Employed)  Risk to self with the past 6 months Suicidal Ideation: No Has patient been a risk to self within the past 6 months prior to admission? : Yes Suicidal Intent: No-Not Currently/Within Last 6 Months Has patient had any suicidal intent within the past 6 months prior to admission? : Yes Is patient at risk for suicide?: No Suicidal Plan?: No-Not Currently/Within Last 6 Months Has patient had any suicidal plan within the past 6 months prior to admission? : Yes Access to Means: No What has been your use of drugs/alcohol within the last 12 months?: Cocaine & Alcohol Previous Attempts/Gestures: No How many times?: 0 Other Self Harm Risks: Reports of none Triggers for Past Attempts: None known Intentional Self Injurious Behavior: None Family Suicide History: No Recent stressful life event(s): Other (Comment) Persecutory voices/beliefs?: No Depression: Yes Depression Symptoms: Tearfulness Substance abuse history and/or treatment for substance abuse?: Yes Suicide prevention information given to non-admitted patients: Not applicable  Risk to Others within the past 6 months Homicidal Ideation: No Does patient have any lifetime risk of violence toward others beyond the six months prior to admission? : No Thoughts of Harm to Others: No Current Homicidal Intent: No Current Homicidal Plan: No Access to Homicidal Means: No Identified Victim: Reports of none History of harm to others?: No Assessment of Violence: None Noted Violent Behavior Description: Reports Does patient have access to weapons?: No Criminal Charges Pending?: No Does patient have a court date: No Is patient on probation?: No  Psychosis Hallucinations: None noted Delusions: None noted  Mental Status Report Appearance/Hygiene: Unremarkable, In scrubs Eye Contact: Good Motor Activity: Freedom of movement, Unremarkable Speech: Logical/coherent,  Unremarkable Level of Consciousness: Alert Mood: Anxious, Sad, Pleasant Affect: Appropriate to circumstance, Anxious Anxiety Level: Minimal Thought Processes: Coherent, Relevant Judgement: Unimpaired Orientation: Person, Place, Time, Situation, Appropriate for developmental age Obsessive Compulsive Thoughts/Behaviors: Minimal  Cognitive Functioning Concentration: Normal Memory: Recent Intact, Remote Intact Is patient IDD: No Insight: Good Impulse Control: Good Appetite: Poor Have you had any weight changes? : No Change Sleep: No Change Total Hours of Sleep: 8 Vegetative Symptoms: None  ADLScreening [facility] Assessment Services) Patient's cognitive ability adequate to safely complete daily activities?: Yes Patient able to express need for assistance with ADLs?: Yes Independently performs ADLs?: Yes (appropriate for developmental age)  Prior Inpatient Therapy Prior Inpatient Therapy: Yes Prior Therapy Dates: 08/2013 and "When I was 15" Prior Therapy Facilty/Provider(s): [facility] Reason for Treatment: Substance Use Treatment and Depression  Prior Outpatient Therapy Prior Outpatient Therapy: Yes Prior Therapy Dates: Current Prior Therapy Facilty/Provider(s): [facility] [place], [place]) Reason for Treatment: Depression and Substance Use Does patient have an ACCT team?: No Does patient have Intensive In-House Services?  : No Does patient have [facility] services? : No Does patient have P4CC services?: No  ADL Screening (condition at time of admission) Patient's cognitive ability adequate to safely complete daily activities?: Yes Is the patient deaf or have difficulty hearing?: No Does the patient have difficulty seeing, even when wearing glasses/contacts?: No Does the patient have difficulty concentrating, remembering, or making decisions?: No Patient able to express need for assistance with ADLs?: Yes Does the  patient have difficulty dressing or bathing?: No Independently  performs ADLs?: Yes (appropriate for developmental age) Does the patient have difficulty walking or climbing stairs?: No Weakness of Legs: None Weakness of Arms/Hands: None  Home Assistive Devices/Equipment Home Assistive Devices/Equipment: None  Therapy Consults (therapy consults require a physician order) PT Evaluation Needed: No OT Evalulation Needed: No SLP Evaluation Needed: No Abuse/Neglect Assessment (Assessment to be complete while patient is alone) Abuse/Neglect Assessment Can Be Completed: Yes Physical Abuse: Denies Verbal Abuse: Denies Sexual Abuse: Denies Exploitation of patient/patient's resources: Denies Self-Neglect: Denies Values / Beliefs Cultural Requests During Hospitalization: None Spiritual Requests During Hospitalization: None Consults Spiritual Care Consult Needed: No Social Work Consult Needed: No Merchant navy officerAdvance Directives (For Healthcare) Does Patient Have a Medical Advance Directive?: No Would patient like information on creating a medical advance directive?: No - Patient declined       Child/Adolescent Assessment Running Away Risk: Denies(Patient is an adult)  Disposition:  Disposition Initial Assessment Completed for this Encounter: Yes  On Site Evaluation by:   Reviewed with Physician:    Lilyan Gilfordalvin J. Toluwanimi Radebaugh MS, LCAS, [facility], [facility] Therapeutic Triage Specialist 05/23/2019 6:59 PM

## 2019-05-23 NOTE — ED Provider Notes (Signed)
[facility] Emergency Department Provider Note  ____________________________________________  Time seen: Approximately 2:43 PM  I have reviewed the triage vital signs and the nursing notes.   HISTORY  Chief Complaint Psychiatric Evaluation    HPI Elaine Warner is a 28 y.o. female with a history of anxiety and depression and drug abuse who was sent to the ED for psychiatric evaluation by her primary care doctor today.  The patient states she is currently in her usual state of health.  She had gone to her primary care doctor to refill medications including benzodiazepines, and during this conversation she relayed to her doctor that 3 months ago in a fight with her boyfriend over their mutual infidelity she made a gesture threatening to jump off a balcony.  In the last 3 months she has not done anything to harm herself and denies SI HI or hallucinations.     Past Medical History:  Diagnosis Date  . Anorexia   . Anorexia   . Depression      Patient Active Problem List   Diagnosis Date Noted  . Polysubstance dependence including opioid type drug, continuous use (HCC) 08/29/2013  . Substance induced mood disorder (HCC) 08/28/2013     Past Surgical History:  Procedure Laterality Date  . BREAST SURGERY       Prior to Admission medications   Medication Sig Start Date End Date Taking? Authorizing Provider  clonazePAM (KLONOPIN) 0.5 MG tablet Take 1 tablet (0.5 mg total) by mouth 2 (two) times daily as needed for anxiety. 10/12/18   Johnna AcostaScarboro, Adam J, NP  etonogestrel-ethinyl estradiol (NUVARING) 0.12-0.015 MG/24HR vaginal ring Place 1 each vaginally every 28 (twenty-eight) days. Insert vaginally and leave in place for 3 consecutive weeks, then remove for 1 week.    [provider]  gabapentin (NEURONTIN) 100 MG capsule Take 100 mg by mouth 3 (three) times daily.    [provider]  hydrOXYzine (ATARAX/VISTARIL) 25 MG tablet Take 1 tablet  (25 mg) three times daily for anxiety symptoms 10/12/18   Johnna AcostaScarboro, Adam J, NP  meclizine (ANTIVERT) 25 MG tablet Take 1 tablet (25 mg total) by mouth daily. 10/12/18   Johnna AcostaScarboro, Adam J, NP     Allergies Lactose intolerance (gi)   No family history on file.  Social History Social History   Tobacco Use  . Smoking status: Current Every Day Smoker    Packs/day: 0.50    Years: 5.00    Pack years: 2.50    Types: Cigarettes  . Smokeless tobacco: Never Used  Substance Use Topics  . Alcohol use: Yes    Alcohol/week: 0.0 standard drinks    Comment: Occasionally  . Drug use: Yes    Types: Cocaine    Comment: Opiates, Benzos    Review of Systems  Constitutional:   No fever or chills.  ENT:   No sore throat. No rhinorrhea. Cardiovascular:   No chest pain or syncope. Respiratory:   No dyspnea or cough. Gastrointestinal:   Negative for abdominal pain, vomiting and diarrhea.  Musculoskeletal:   Negative for focal pain or swelling All other systems reviewed and are negative except as documented above in ROS and HPI.  ____________________________________________   PHYSICAL EXAM:  VITAL SIGNS: ED Triage Vitals  Enc Vitals Group     BP 05/23/19 1332 116/86     Pulse Rate 05/23/19 1332 96     Resp 05/23/19 1332 16     Temp --      Temp  src --      SpO2 05/23/19 1332 97 %     Weight 05/23/19 1307 128 lb (58.1 kg)     Height 05/23/19 1307 5\' 3"  (1.6 m)     Head Circumference --      Peak Flow --      Pain Score 05/23/19 1306 0     Pain Loc --      Pain Edu? --      Excl. in GC? --     Vital signs reviewed, nursing assessments reviewed.   Constitutional:   Alert and oriented. Non-toxic appearance. Eyes:   Conjunctivae are normal. EOMI. ENT      Head:   Normocephalic and atraumatic. Cardiovascular:   RRR. Symmetric bilateral radial and DP pulses.  No murmurs. Cap refill less than 2 seconds. Respiratory:   Normal respiratory effort without tachypnea/retractions. Breath  sounds are clear and equal bilaterally. No wheezes/rales/rhonchi. Musculoskeletal:   Normal range of motion in all extremities.  No edema. Neurologic:   Normal speech and language.  Motor grossly intact. No acute focal neurologic deficits are appreciated.  Skin:    Skin is warm, dry and intact. No rash noted.  No wounds.  ____________________________________________    LABS (pertinent positives/negatives) (all labs ordered are listed, but only abnormal results are displayed) Labs Reviewed  COMPREHENSIVE METABOLIC PANEL - Abnormal; Notable for the following components:      Result Value   Glucose, Bld 102 (*)    All other components within normal limits  ACETAMINOPHEN LEVEL - Abnormal; Notable for the following components:   Acetaminophen (Tylenol), Serum <10 (*)    All other components within normal limits  ETHANOL  SALICYLATE LEVEL  CBC  URINE DRUG SCREEN, QUALITATIVE ([facility] ONLY)  POC URINE PREG, ED   ____________________________________________   EKG  ____________________________________________    RADIOLOGY  No results found.  ____________________________________________   PROCEDURES Procedures  ____________________________________________  CLINICAL IMPRESSION / ASSESSMENT AND PLAN / ED COURSE  Pertinent labs & imaging results that were available during my care of the patient were reviewed by me and considered in my medical decision making (see chart for details).  Elaine Warner was evaluated in Emergency Department on 05/23/2019 for the symptoms described in the history of present illness. She was evaluated in the context of the global COVID-19 pandemic, which necessitated consideration that the patient might be at risk for infection with the SARS-CoV-2 virus that causes COVID-19. Institutional protocols and algorithms that pertain to the evaluation of patients at risk for COVID-19 are in a state of rapid change based on information released by regulatory  bodies including the CDC and federal and state organizations. These policies and algorithms were followed during the patient's care in the ED.   Patient sent to ED for psychiatric evaluation due to incident 3 months ago.  She is in her usual state of health, no acute complaints at this time, not an imminent danger to herself or others.  Vital signs are normal.  Exam is reassuring.  Psychiatry consult requested, discussed with psychiatrist after their evaluation, they find her to be stable for discharge as well and she does have behavioral health follow-up.  Can continue outpatient management.      ____________________________________________   FINAL CLINICAL IMPRESSION(S) / ED DIAGNOSES    Final diagnoses:  Anxiety     ED Discharge Orders    None      Portions of this note were generated with dragon dictation software. Dictation errors  may occur despite best attempts at proofreading.   Carrie Mew, MD 05/23/19 1455

## 2019-05-23 NOTE — Consult Note (Signed)
[facility]   Reason for Consult: Port of recent suicide attempt Referring Physician: Alfonse FlavorsPhilip Stafford Patient Identification: Elaine CollarMarketa J Heather MRN:  [id number] Principal Diagnosis: <principal problem not specified> Diagnosis:  Active Problems:   * No active hospital problems. *   Total Time spent with patient: 45 minutes  Subjective:   Elaine Warner is a 28 y.o. female patient who presented from her PCPs office after reporting suicide attempt approximately 3 months ago.  HPI: Patient is a 28 year old female who presents to the ED after telling her PCP that she attempted suicide 3 months earlier.  Patient states that she has been feeling increasing anxiety and went to her PCPs office in an attempt to get an increase in her clonazepam medication.  Patient states that in an effort to do this she exaggerated her symptoms and told the provider about a suicide attempt she had 3 months ago.  The detail she provided him was that she was on the roof of a building about to jump and someone stopped her.  She was then told by the PCP that she would either have to go to the emergency department or he would call the sheriff.  Patient decided to self present.  She maintains that she is not currently suicidal and states that 3 months ago was just an impulsive reaction to a bad conversation.  Patient goes on to provide details about the attempt.  She states that 3 months ago she got into an argument with her boyfriend regarding her infidelity and  his infidelity.  He lives on the top floor of his building and she opened up the window and gestured as if she was going to jump out to which her boyfriend stopped her.  Patient states that since this at this time she has never attempted any more she denies any previous attempts she denies that this was an actual attempt and states rather it was a "silly reaction" during an emotional time. 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  Despite the stressors patient's feels that she is able to maintain herself and denies the need for emergent psychiatric care.  Patient states that she is currently receiving care from a psychiatric NP as well as a therapist.  She states that she has not seen them as much as she would like due to scheduling conflicts but states that she will make an appointment with both of them in the coming weeks.  Past Psychiatric History: Patient states she is been involved with psychiatric care since a young age.  She endorses aching medication since a young age.  She is currently prescribed clonazepam Latuda and zolpidem.  Patient reports compliance with these medications as well as compliance with appointments.  Denies previous psychiatric hospitalizations denies previous suicide attempts.  Patient also acknowledges a history of anorexia although currently at 5 '3, 128 pounds is at a healthy weight. Risk to Self:  No risk to Others:  No Prior Inpatient Therapy:  No Prior Outpatient Therapy:  Yes  Past Medical History:  Past Medical History:  Diagnosis Date  . Anorexia   . Anorexia   . Depression     Past Surgical History:  Procedure Laterality Date  . BREAST SURGERY     Family History: No family history on file. Family Psychiatric  History:  Patient does not know her father.  She also has a very poor relationship with her mother.  Denies known psychiatric history Social History:  Social History   Substance and Sexual Activity  Alcohol Use Yes  . Alcohol/week: 0.0 standard drinks   Comment: Occasionally     Social History   Substance and Sexual Activity  Drug Use Yes  . Types: Cocaine   Comment: Opiates, Benzos    Social History   Socioeconomic History  . Marital status: Single    Spouse name: Not on  file  . Number of children: Not on file  . Years of education: Not on file  . Highest education level: Not on file  Occupational History  . Not on file  Social Needs  . Financial resource strain: Not on file  . Food insecurity    Worry: Not on file    Inability: Not on file  . Transportation needs    Medical: Not on file    Non-medical: Not on file  Tobacco Use  . Smoking status: Current Every Day Smoker    Packs/day: 0.50    Years: 5.00    Pack years: 2.50    Types: Cigarettes  . Smokeless tobacco: Never Used  Substance and Sexual Activity  . Alcohol use: Yes    Alcohol/week: 0.0 standard drinks    Comment: Occasionally  . Drug use: Yes    Types: Cocaine    Comment: Opiates, Benzos  . Sexual activity: Yes    Birth control/protection: None  Lifestyle  . Physical activity    Days per week: Not on file    Minutes per session: Not on file  . Stress: Not on file  Relationships  . Social Herbalist on phone: Not on file    Gets together: Not on file    Attends religious service: Not on file    Active member of club or organization: Not on file    Attends meetings of clubs or organizations: Not on file    Relationship status: Not on file  Other Topics Concern  . Not on file  Social History Narrative  . Not on file   Additional Social History: Reports using cocaine recreationally last use approximately 3 days ago    Allergies:   Allergies  Allergen Reactions  . Lactose Intolerance (Gi) Other (See Comments)    Bloating     Labs:  Results for orders placed or performed during the hospital encounter of 05/23/19 (from the past 48 hour(s))  Comprehensive metabolic panel     Status: Abnormal   Collection Time: 05/23/19  1:06 PM  Result Value Ref Range   Sodium 137 135 - 145 mmol/L   Potassium 4.2 3.5 - 5.1 mmol/L   Chloride 104 98 - 111 mmol/L   CO2 25 22 - 32 mmol/L   Glucose, Bld 102 (H) 70 - 99 mg/dL   BUN 15 6 - 20 mg/dL   Creatinine, Ser 0.80  0.44 - 1.00 mg/dL   Calcium 9.5 8.9 - 10.3 mg/dL   Total Protein 7.3 6.5 - 8.1 g/dL   Albumin 4.6 3.5 - 5.0 g/dL   AST 17 15 - 41 U/L   ALT 12 0 - 44 U/L   Alkaline Phosphatase 47 38 - 126 U/L   Total Bilirubin 1.0 0.3 - 1.2 mg/dL   GFR calc non Af Amer >60 >60 mL/min   GFR calc Af Amer >60 >60 mL/min   Anion gap 8 5 -  15    Comment: Performed at [facility], [address] [place], [place] [address]  Ethanol     Status: None   Collection Time: 05/23/19  1:06 PM  Result Value Ref Range   Alcohol, Ethyl (B) <10 <10 mg/dL    Comment: (NOTE) Lowest detectable limit for serum alcohol is 10 mg/dL. For medical purposes only. Performed at [facility], [address] [place], [place] [address]   Salicylate level     Status: None   Collection Time: 05/23/19  1:06 PM  Result Value Ref Range   Salicylate Lvl <7.0 2.8 - 30.0 mg/dL    Comment: Performed at [facility], [address] [place], [place] [address]  Acetaminophen level     Status: Abnormal   Collection Time: 05/23/19  1:06 PM  Result Value Ref Range   Acetaminophen (Tylenol), Serum <10 (L) 10 - 30 ug/mL    Comment: (NOTE) Therapeutic concentrations vary significantly. A range of 10-30 ug/mL  may be an effective concentration for many patients. However, some  are best treated at concentrations outside of this range. Acetaminophen concentrations >150 ug/mL at 4 hours after ingestion  and >50 ug/mL at 12 hours after ingestion are often associated with  toxic reactions. Performed at [facility], [address] [place], [place] [address]   cbc     Status: None   Collection Time: 05/23/19  1:06 PM  Result Value Ref Range   WBC >7.8 4.0 - 10.5 K/uL   RBC 3.89 3.87 - 5.11 MIL/uL   Hemoglobin 12.2 12.0 - 15.0 g/dL   HCT 53.6 64.4 - 03.4 %   MCV 93.8 80.0 - 100.0 fL   MCH 31.4 26.0 - 34.0 pg   MCHC 33.4 30.0 - 36.0 g/dL   RDW 74.2 59.5 - 63.8 %   Platelets 246 150 - 400 K/uL    nRBC 0.0 0.0 - 0.2 %    Comment: Performed at [facility], [address] [place], [place] [address]    No current facility-administered medications for this encounter.    Current Outpatient Medications  Medication Sig Dispense Refill  . clonazePAM (KLONOPIN) 0.5 MG tablet Take 1 tablet (0.5 mg total) by mouth 2 (two) times daily as needed for anxiety. 60 tablet 1  . etonogestrel-ethinyl estradiol (NUVARING) 0.12-0.015 MG/24HR vaginal ring Place 1 each vaginally every 28 (twenty-eight) days. Insert vaginally and leave in place for 3 consecutive weeks, then remove for 1 week.    . gabapentin (NEURONTIN) 100 MG capsule Take 100 mg by mouth 3 (three) times daily.    . hydrOXYzine (ATARAX/VISTARIL) 25 MG tablet Take 1 tablet (25 mg) three times daily for anxiety symptoms 90 tablet 0  . meclizine (ANTIVERT) 25 MG tablet Take 1 tablet (25 mg total) by mouth daily. 30 tablet 0    Musculoskeletal: Strength & Muscle Tone: within normal limits Gait & Station: normal Patient leans: N/A  Psychiatric Specialty Exam: Physical Exam  Review of Systems  Psychiatric/Behavioral: Positive for substance abuse. Negative for depression, hallucinations, memory loss and suicidal ideas. The patient is nervous/anxious. The patient does not have insomnia.   All other systems reviewed and are negative.   Blood pressure 116/86, pulse 96, resp. rate 16, height 5\' 3"  (1.6 m), weight 58.1 kg, SpO2 97 %.Body mass index is 22.67 kg/m.  General Appearance: Neat and Well Groomed  Eye Contact:  Fair  Speech:  Normal Rate  Volume:  Normal  Mood:  Anxious  and Euthymic  Affect:  Congruent  Thought Process:  Coherent  Orientation:  Full (Time, Place, and Person)  Thought Content:  Logical  Suicidal Thoughts:  No  Homicidal Thoughts:  No  Memory:  Remote;   Good  Judgement:  Fair  Insight:  Fair  Psychomotor Activity:  Normal  Concentration:  Concentration: Fair  Recall:  Good  Fund of Knowledge:  Good   Language:  Good  Akathisia:  No  Handed:  Right  AIMS (if indicated):     Assets:  Communication Skills Desire for Improvement Housing Leisure Time Social Support Talents/Skills  ADL's:  Intact  Cognition:  WNL  Sleep:        Treatment Plan Summary: Philip Stafford32 year old female who presents following a report to her PCP that she had a suicide attempt.  Upon further assessment it is deemed that the incident was more of a gesture, not an attempt.  Patient denies any current suicidal ideation or significant psychiatric illness.  She is engaged in the outpatient care and willing to follow-up on an outpatient basis.  Clear for discharge to the community  Disposition: No evidence of imminent risk to self or others at present.   Patient does not meet criteria for psychiatric inpatient admission. Discussed crisis plan, support from social network, calling 911, coming to the Emergency Department, and calling Suicide Hotline.  Clement Sayres, MD 05/23/2019 2:44 PM

## 2019-05-23 NOTE — ED Triage Notes (Signed)
Pt in via POV, sent over from PCP due to behavioral med evaluation.  Pt reports increasing anxiety without relief when using her prescribed medications, seen PCP today in attempt to having medication adjusted.  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  I just had a moment."  Due to this information, PCP advised patient to come here voluntatirly or he would call the [facility].  Pt currently denies HI/SI.  Pt calm, cooperative, NAD noted at this time.

## 2019-05-23 NOTE — ED Notes (Signed)
Psych MD at bedside

## 2019-05-23 NOTE — ED Notes (Addendum)
PT sent over by PCP for psych eval. Pt states she had SI  x87months ago but denies any SI/HI at this time.

## 2019-05-23 NOTE — Progress Notes (Signed)
[facility] [place], [place] [address]  Internal MEDICINE  Office Visit Note  Patient Name: Elaine Warner  [id number]  [id number]  Date of Service: 05/23/2019  Chief Complaint  Patient presents with  . Anxiety    increased anxiety , has tried to commit suicide . fits of anger      HPI Pt is here for a sick visit. Pt is here for increased anxiety.  She states that she is currently seeing Elaine Chen NP at the [place] for psych.  She currently takes [place], [place], and clonazepam currently.  She believes she needs to increase her clozazepam at this time due to increased anxiety.  She is positive for cocaine on todays visit. She reports 2.5 months ago she tried to kill herself by jumping off a buliding, she was stopped by a friend.        Current Medication:  Outpatient Encounter Medications as of 05/23/2019  Medication Sig  . clonazePAM (KLONOPIN) 0.5 MG tablet Take 1 tablet (0.5 mg total) by mouth 2 (two) times daily as needed for anxiety.  Marland Kitchen etonogestrel-ethinyl estradiol (NUVARING) 0.12-0.015 MG/24HR vaginal ring Place 1 each vaginally every 28 (twenty-eight) days. Insert vaginally and leave in place for 3 consecutive weeks, then remove for 1 week.  . gabapentin (NEURONTIN) 100 MG capsule Take 100 mg by mouth 3 (three) times daily.  . hydrOXYzine (ATARAX/VISTARIL) 25 MG tablet Take 1 tablet (25 mg) three times daily for anxiety symptoms  . meclizine (ANTIVERT) 25 MG tablet Take 1 tablet (25 mg total) by mouth daily.  . [DISCONTINUED] meloxicam (MOBIC) 15 MG tablet Take 1 tablet (15 mg total) by mouth daily. (Patient not taking: Reported on 10/12/2018)   No facility-administered encounter medications on file as of 05/23/2019.       Medical History: Past Medical History:  Diagnosis Date  . Anorexia   . Depression      Vital Signs: BP 110/80   Pulse 70   Temp 97.9 F (36.6 C)   Resp 16   Ht 5\' 3"  (1.6 m)   Wt 126 lb (57.2 kg)   SpO2 97%    BMI 22.32 kg/m    Review of Systems  Constitutional: Negative for chills, fatigue and unexpected weight change.  HENT: Negative for congestion, rhinorrhea, sneezing and sore throat.   Eyes: Negative for photophobia, pain and redness.  Respiratory: Negative for cough, chest tightness and shortness of breath.   Cardiovascular: Negative for chest pain and palpitations.  Gastrointestinal: Negative for abdominal pain, constipation, diarrhea, nausea and vomiting.  Endocrine: Negative.   Genitourinary: Negative for dysuria and frequency.  Musculoskeletal: Negative for arthralgias, back pain, joint swelling and neck pain.  Skin: Negative for rash.  Allergic/Immunologic: Negative.   Neurological: Negative for tremors and numbness.  Hematological: Negative for adenopathy. Does not bruise/bleed easily.  Psychiatric/Behavioral: Positive for agitation, sleep disturbance and suicidal ideas. Negative for behavioral problems. The patient is nervous/anxious.     Physical Exam Vitals signs and nursing note reviewed.  Constitutional:      General: She is not in acute distress.    Appearance: She is well-developed. She is not diaphoretic.  HENT:     Head: Normocephalic and atraumatic.     Mouth/Throat:     Pharynx: No oropharyngeal exudate.  Eyes:     Pupils: Pupils are equal, round, and reactive to light.  Neck:     Musculoskeletal: Normal range of motion and neck supple.     Thyroid:  No thyromegaly.     Vascular: No JVD.     Trachea: No tracheal deviation.  Cardiovascular:     Rate and Rhythm: Normal rate and regular rhythm.     Heart sounds: Normal heart sounds. No murmur. No friction rub. No gallop.   Pulmonary:     Effort: Pulmonary effort is normal. No respiratory distress.     Breath sounds: Normal breath sounds. No wheezing or rales.  Chest:     Chest wall: No tenderness.  Abdominal:     Palpations: Abdomen is soft.     Tenderness: There is no abdominal tenderness. There is no  guarding.  Musculoskeletal: Normal range of motion.  Lymphadenopathy:     Cervical: No cervical adenopathy.  Skin:    General: Skin is warm and dry.  Neurological:     Mental Status: She is alert and oriented to person, place, and time.     Cranial Nerves: No cranial nerve deficit.    Assessment/Plan: 1. GAD (generalized anxiety disorder) Pts anxiety is high, and we discussed her need to discuss medication changes when her psych provider is available.  She agrees with this plan.    2. Suicidal behavior without attempted self-injury After discussing this suicide attempt, I feel that patient needs inpatient evaluation by a psych provider.  She agrees and states she assumed I would say that. We discussed she should check herself in to the hospital for an evaluation especially after finding that she has been using cocaine with her medications. She will go to the ER at this time.    3. Tobacco dependence Smoking cessation counseling: 1. Pt acknowledges the risks of long term smoking, she will try to quite smoking. 2. Options for different medications including nicotine products, chewing gum, patch etc, Wellbutrin and Chantix is discussed 3. Goal and date of compete cessation is discussed 4. Total time spent in smoking cessation is 15 min.   4. Encounter for long-term (current) use of high-risk medication Positive Cocaine.  - POCT Urine Drug Screen  General Counseling: Elaine Warner verbalizes understanding of the findings of todays visit and agrees with plan of treatment. I have discussed any further diagnostic evaluation that may be needed or ordered today. We also reviewed her medications today. she has been encouraged to call the office with any questions or concerns that should arise related to todays visit.   Orders Placed This Encounter  Procedures  . POCT Urine Drug Screen    No orders of the defined types were placed in this encounter.   Time spent:25 Minutes  This patient was  seen by Blima Ledger AGNP-C in Collaboration with Dr Lyndon Code as a part of collaborative care agreement.  Johnna Acosta AGNP-C Internal Medicine

## 2019-06-21 ENCOUNTER — Ambulatory Visit: Payer: 59 | Admitting: Adult Health

## 2019-07-02 ENCOUNTER — Ambulatory Visit: Payer: 59 | Admitting: Adult Health

## 2020-02-20 ENCOUNTER — Ambulatory Visit: Payer: 59

## 2020-04-17 ENCOUNTER — Encounter: Payer: Self-pay | Admitting: Emergency Medicine

## 2020-04-17 ENCOUNTER — Ambulatory Visit
Admission: EM | Admit: 2020-04-17 | Discharge: 2020-04-17 | Disposition: A | Discharge status: Home / Self Care | Payer: 59 | Attending: Family Medicine | Admitting: Family Medicine

## 2020-04-17 ENCOUNTER — Other Ambulatory Visit: Payer: Self-pay

## 2020-04-17 DIAGNOSIS — J029 Acute pharyngitis, unspecified: Secondary | ICD-10-CM | POA: Insufficient documentation

## 2020-04-17 LAB — GROUP A STREP BY PCR: Group A Strep by PCR: NOT DETECTED

## 2020-04-17 MED ORDER — KETOROLAC TROMETHAMINE 10 MG PO TABS: 10.0000 mg | ORAL_TABLET | Freq: Four times a day (QID) | ORAL | 0 refills | Status: AC | PRN

## 2020-04-17 MED ORDER — AZITHROMYCIN 250 MG PO TABS: ORAL_TABLET | ORAL | 0 refills | Status: AC

## 2020-04-17 NOTE — ED Triage Notes (Signed)
Patient in today c/o bilateral otalgia L>R and sore throat x 2 days. Patient denies fever. Patient has not been vaccinated for covid. Patient took Advil once without relief.

## 2020-04-17 NOTE — ED Provider Notes (Signed)
[facility]    CSN: [id number] Arrival date & time: 04/17/20  4315  History   Chief Complaint Chief Complaint  Patient presents with  . Otalgia  . Sore Throat   HPI  29 year old female presents with sore throat and ear pain.  2-day history of the above symptoms.  Severe sore throat and bilateral ear pain.  She rates her pain a 6/10 in severity.  She states that she has difficulty swallowing.  She has taken Advil without resolution.  No fever.  No other reported symptoms.  No Covid exposure.  Declines Covid testing today.  No other complaints.  Past Medical History:  Diagnosis Date  . Anorexia   . Anorexia   . Depression    Patient Active Problem List   Diagnosis Date Noted  . Anxiety state 05/23/2019  . Polysubstance dependence including opioid type drug, continuous use (HCC) 08/29/2013  . Substance induced mood disorder (HCC) 08/28/2013   Past Surgical History:  Procedure Laterality Date  . BREAST SURGERY     augmentation    OB History    Gravida  2   Para  0   Term  0   Preterm  0   AB  2   Living  0     SAB  1   TAB  1   Ectopic  0   Multiple  0   Live Births               Home Medications    Prior to Admission medications   Medication Sig Start Date End Date Taking? Authorizing Provider  clonazePAM (KLONOPIN) 0.5 MG tablet Take 1 tablet (0.5 mg total) by mouth 2 (two) times daily as needed for anxiety. 10/12/18  Yes Johnna Acosta, NP  etonogestrel-ethinyl estradiol (NUVARING) 0.12-0.015 MG/24HR vaginal ring Place 1 each vaginally every 28 (twenty-eight) days. Insert vaginally and leave in place for 3 consecutive weeks, then remove for 1 week.   Yes [provider]  gabapentin (NEURONTIN) 100 MG capsule Take 100 mg by mouth 3 (three) times daily.   Yes [provider]  meclizine (ANTIVERT) 25 MG tablet Take 1 tablet (25 mg total) by mouth daily. 10/12/18  Yes Johnna Acosta, NP  azithromycin (ZITHROMAX) 250  MG tablet 2 tablets on day 1, then 1 tablet daily on days 2-5. 04/17/20   Tommie Sams, DO  ketorolac (TORADOL) 10 MG tablet Take 1 tablet (10 mg total) by mouth every 6 (six) hours as needed for moderate pain or severe pain. 04/17/20   Tommie Sams, DO  hydrOXYzine (ATARAX/VISTARIL) 25 MG tablet Take 1 tablet (25 mg) three times daily for anxiety symptoms 10/12/18 04/17/20  Johnna Acosta, NP    Family History Family History  Problem Relation Age of Onset  . Healthy Mother   . Healthy Father     Social History Social History   Tobacco Use  . Smoking status: Current Every Day Smoker    Packs/day: 0.25    Years: 5.00    Pack years: 1.25    Types: Cigarettes  . Smokeless tobacco: Never Used  Vaping Use  . Vaping Use: Never used  Substance Use Topics  . Alcohol use: Not Currently    Alcohol/week: 0.0 standard drinks    Comment: Occasionally  . Drug use: Not Currently    Types: Cocaine    Comment: Opiates, Benzos     Allergies   Lactose intolerance (gi)   Review of Systems  Review of Systems  Constitutional: Negative for fever.  HENT: Positive for ear pain and sore throat.    Physical Exam Triage Vital Signs ED Triage Vitals  Enc Vitals Group     BP 04/17/20 0910 (!) 119/91     Pulse Rate 04/17/20 0910 80     Resp 04/17/20 0910 18     Temp 04/17/20 0910 98.6 F (37 C)     Temp Source 04/17/20 0910 Oral     SpO2 04/17/20 0910 100 %     Weight 04/17/20 0911 126 lb (57.2 kg)     Height 04/17/20 0911 5\' 3"  (1.6 m)     Head Circumference --      Peak Flow --      Pain Score 04/17/20 0910 6     Pain Loc --      Pain Edu? --      Excl. in GC? --    No data found.  Updated Vital Signs BP (!) 119/91 (BP Location: Left Arm)   Pulse 80   Temp 98.6 F (37 C) (Oral)   Resp 18   Ht 5\' 3"  (1.6 m)   Wt 57.2 kg   LMP 04/16/2020 (Approximate) Comment: Nuvaring  SpO2 100%   BMI 22.32 kg/m   Visual Acuity Right Eye Distance:   Left Eye Distance:   Bilateral  Distance:    Right Eye Near:   Left Eye Near:    Bilateral Near:     Physical Exam Vitals and nursing note reviewed.  Constitutional:      General: She is not in acute distress.    Appearance: Normal appearance. She is not ill-appearing.  HENT:     Head: Normocephalic and atraumatic.     Right Ear: Tympanic membrane normal.     Left Ear: Tympanic membrane normal.     Mouth/Throat:     Comments: Oropharynx with erythema.  2+ tonsils.  No exudate. Eyes:     General:        Right eye: No discharge.        Left eye: No discharge.     Conjunctiva/sclera: Conjunctivae normal.  Cardiovascular:     Rate and Rhythm: Normal rate and regular rhythm.  Pulmonary:     Effort: Pulmonary effort is normal.     Breath sounds: Normal breath sounds. No wheezing, rhonchi or rales.  Neurological:     Mental Status: She is alert.    UC Treatments / Results  Labs (all labs ordered are listed, but only abnormal results are displayed) Labs Reviewed  GROUP A STREP BY PCR    EKG   Radiology No results found.  Procedures Procedures (including critical care time)  Medications Ordered in UC Medications - No data to display  Initial Impression / Assessment and Plan / UC Course  I have reviewed the triage vital signs and the nursing notes.  Pertinent labs & imaging results that were available during my care of the patient were reviewed by me and considered in my medical decision making (see chart for details).    29 year old female presents with pharyngitis.  Strep PCR negative.  Given severity of symptoms and examination, placing empirically on azithromycin to cover for atypicals and other strep subtypes.  Toradol for pain.  Final Clinical Impressions(s) / UC Diagnoses   Final diagnoses:  Pharyngitis, unspecified etiology   Discharge Instructions   None    ED Prescriptions    Medication Sig Dispense Auth. Provider   azithromycin [facility])  250 MG tablet 2 tablets on day 1, then  1 tablet daily on days 2-5. 6 tablet Azavion Bouillon G, DO   ketorolac (TORADOL) 10 MG tablet Take 1 tablet (10 mg total) by mouth every 6 (six) hours as needed for moderate pain or severe pain. 20 tablet Tommie Sams, DO     PDMP not reviewed this encounter.   Tommie Sams, [place] 04/17/20 [id number]

## 2020-04-17 NOTE — ED Triage Notes (Signed)
When I started to collect specimen, patient refused covid test.

## 2024-02-07 ENCOUNTER — Other Ambulatory Visit: Payer: Self-pay | Admitting: Neurology

## 2024-02-07 DIAGNOSIS — R531 Weakness: Secondary | ICD-10-CM
